# Patient Record
Sex: Female | Born: 1940 | Race: White | Hispanic: No | Marital: Married | Smoking: Never smoker
Health system: Southern US, Community
[De-identification: ages and names within clinical notes are randomized; demographics above are authoritative.]

## PROBLEM LIST (undated history)

## (undated) DIAGNOSIS — N813 Complete uterovaginal prolapse: Secondary | ICD-10-CM

## (undated) DIAGNOSIS — E878 Other disorders of electrolyte and fluid balance, not elsewhere classified: Secondary | ICD-10-CM

## (undated) DIAGNOSIS — L570 Actinic keratosis: Secondary | ICD-10-CM

## (undated) DIAGNOSIS — G3184 Mild cognitive impairment, so stated: Secondary | ICD-10-CM

## (undated) DIAGNOSIS — I10 Essential (primary) hypertension: Secondary | ICD-10-CM

## (undated) DIAGNOSIS — J438 Other emphysema: Secondary | ICD-10-CM

## (undated) DIAGNOSIS — K529 Noninfective gastroenteritis and colitis, unspecified: Secondary | ICD-10-CM

## (undated) DIAGNOSIS — R7303 Prediabetes: Secondary | ICD-10-CM

## (undated) DIAGNOSIS — N814 Uterovaginal prolapse, unspecified: Secondary | ICD-10-CM

## (undated) DIAGNOSIS — N393 Stress incontinence (female) (male): Secondary | ICD-10-CM

## (undated) DIAGNOSIS — Z9109 Other allergy status, other than to drugs and biological substances: Secondary | ICD-10-CM

## (undated) DIAGNOSIS — K5792 Diverticulitis of intestine, part unspecified, without perforation or abscess without bleeding: Secondary | ICD-10-CM

## (undated) DIAGNOSIS — E039 Hypothyroidism, unspecified: Secondary | ICD-10-CM

## (undated) DIAGNOSIS — N189 Chronic kidney disease, unspecified: Secondary | ICD-10-CM

## (undated) DIAGNOSIS — N1831 Chronic kidney disease, stage 3a: Secondary | ICD-10-CM

## (undated) DIAGNOSIS — C50919 Malignant neoplasm of unspecified site of unspecified female breast: Secondary | ICD-10-CM

## (undated) HISTORY — DX: Noninfective gastroenteritis and colitis, unspecified: K52.9

## (undated) HISTORY — PX: COLONOSCOPY: SHX174

## (undated) HISTORY — DX: Actinic keratosis: L57.0

## (undated) HISTORY — DX: Malignant neoplasm of unspecified site of unspecified female breast: C50.919

## (undated) HISTORY — DX: Diverticulitis of intestine, part unspecified, without perforation or abscess without bleeding: K57.92

## (undated) HISTORY — DX: Other disorders of electrolyte and fluid balance, not elsewhere classified: E87.8

---
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
## 2004-11-08 ENCOUNTER — Emergency Department: Payer: Self-pay | Admitting: Unknown Physician Specialty

## 2004-11-18 ENCOUNTER — Emergency Department: Payer: Self-pay | Admitting: Emergency Medicine

## 2005-12-29 ENCOUNTER — Ambulatory Visit: Payer: Self-pay

## 2006-01-11 ENCOUNTER — Ambulatory Visit: Payer: Self-pay

## 2006-05-18 DIAGNOSIS — C4491 Basal cell carcinoma of skin, unspecified: Secondary | ICD-10-CM

## 2006-05-18 HISTORY — DX: Basal cell carcinoma of skin, unspecified: C44.91

## 2006-11-23 DIAGNOSIS — C439 Malignant melanoma of skin, unspecified: Secondary | ICD-10-CM

## 2006-11-23 HISTORY — DX: Malignant melanoma of skin, unspecified: C43.9

## 2007-01-18 DIAGNOSIS — D239 Other benign neoplasm of skin, unspecified: Secondary | ICD-10-CM

## 2007-01-18 DIAGNOSIS — D229 Melanocytic nevi, unspecified: Secondary | ICD-10-CM

## 2007-01-18 HISTORY — DX: Melanocytic nevi, unspecified: D22.9

## 2007-01-18 HISTORY — DX: Other benign neoplasm of skin, unspecified: D23.9

## 2007-07-29 ENCOUNTER — Emergency Department: Payer: Self-pay | Admitting: Internal Medicine

## 2007-08-13 ENCOUNTER — Emergency Department: Payer: Self-pay | Admitting: Emergency Medicine

## 2009-01-12 DIAGNOSIS — C50919 Malignant neoplasm of unspecified site of unspecified female breast: Secondary | ICD-10-CM

## 2009-01-12 HISTORY — DX: Malignant neoplasm of unspecified site of unspecified female breast: C50.919

## 2009-02-12 HISTORY — PX: BREAST SURGERY: SHX581

## 2009-02-12 HISTORY — PX: BREAST LUMPECTOMY: SHX2

## 2012-01-26 ENCOUNTER — Ambulatory Visit: Payer: Self-pay

## 2012-02-13 ENCOUNTER — Ambulatory Visit: Payer: Self-pay

## 2012-03-12 ENCOUNTER — Ambulatory Visit: Payer: Self-pay

## 2012-04-12 ENCOUNTER — Ambulatory Visit: Payer: Self-pay

## 2012-05-27 ENCOUNTER — Emergency Department: Payer: Self-pay | Admitting: Emergency Medicine

## 2012-05-27 LAB — BASIC METABOLIC PANEL
Anion Gap: 6 — ABNORMAL LOW (ref 7–16)
BUN: 19 mg/dL — ABNORMAL HIGH (ref 7–18)
Calcium, Total: 8.9 mg/dL (ref 8.5–10.1)
Chloride: 108 mmol/L — ABNORMAL HIGH (ref 98–107)
Co2: 26 mmol/L (ref 21–32)
Creatinine: 0.77 mg/dL (ref 0.60–1.30)
EGFR (African American): >60
EGFR (Non-African Amer.): >60
Glucose: 150 mg/dL — ABNORMAL HIGH (ref 65–99)
Sodium: 140 mmol/L (ref 136–145)

## 2012-05-27 LAB — CBC
HCT: 39.9 % (ref 35.0–47.0)
HGB: 13.5 g/dL (ref 12.0–16.0)
MCH: 30.3 pg (ref 26.0–34.0)
MCHC: 33.9 g/dL (ref 32.0–36.0)
MCV: 89 fL (ref 80–100)
Platelet: 185 10*3/uL (ref 150–440)
RBC: 4.46 10*6/uL (ref 3.80–5.20)
WBC: 13 10*3/uL — ABNORMAL HIGH (ref 3.6–11.0)

## 2013-07-03 DIAGNOSIS — E039 Hypothyroidism, unspecified: Secondary | ICD-10-CM | POA: Diagnosis present

## 2013-07-03 DIAGNOSIS — K219 Gastro-esophageal reflux disease without esophagitis: Secondary | ICD-10-CM | POA: Diagnosis present

## 2013-07-03 DIAGNOSIS — E7800 Pure hypercholesterolemia, unspecified: Secondary | ICD-10-CM | POA: Diagnosis present

## 2014-12-28 ENCOUNTER — Emergency Department
Admission: EM | Admit: 2014-12-28 | Discharge: 2014-12-28 | Disposition: A | Discharge status: Home / Self Care | Payer: Medicare PPO | Attending: Emergency Medicine | Admitting: Emergency Medicine

## 2014-12-28 DIAGNOSIS — R197 Diarrhea, unspecified: Secondary | ICD-10-CM | POA: Diagnosis not present

## 2014-12-28 DIAGNOSIS — Z7982 Long term (current) use of aspirin: Secondary | ICD-10-CM | POA: Insufficient documentation

## 2014-12-28 DIAGNOSIS — H539 Unspecified visual disturbance: Secondary | ICD-10-CM | POA: Insufficient documentation

## 2014-12-28 DIAGNOSIS — Z79899 Other long term (current) drug therapy: Secondary | ICD-10-CM | POA: Insufficient documentation

## 2014-12-28 DIAGNOSIS — R112 Nausea with vomiting, unspecified: Secondary | ICD-10-CM | POA: Insufficient documentation

## 2014-12-28 DIAGNOSIS — N390 Urinary tract infection, site not specified: Secondary | ICD-10-CM | POA: Diagnosis not present

## 2014-12-28 LAB — URINALYSIS COMPLETE WITH MICROSCOPIC (ARMC ONLY)
Bilirubin Urine: NEGATIVE
Glucose, UA: NEGATIVE mg/dL
KETONES UR: NEGATIVE mg/dL
NITRITE: POSITIVE — AB
PH: 6 (ref 5.0–8.0)
PROTEIN: NEGATIVE mg/dL
SPECIFIC GRAVITY, URINE: 1.014 (ref 1.005–1.030)

## 2014-12-28 LAB — COMPREHENSIVE METABOLIC PANEL
ALBUMIN: 4.6 g/dL (ref 3.5–5.0)
ALT: 26 U/L (ref 14–54)
ANION GAP: 7 (ref 5–15)
AST: 24 U/L (ref 15–41)
Alkaline Phosphatase: 62 U/L (ref 38–126)
BUN: 24 mg/dL — AB (ref 6–20)
CHLORIDE: 108 mmol/L (ref 101–111)
CO2: 24 mmol/L (ref 22–32)
Calcium: 9 mg/dL (ref 8.9–10.3)
Creatinine, Ser: 0.78 mg/dL (ref 0.44–1.00)
GFR calc Af Amer: >60 mL/min (ref >60)
GFR calc non Af Amer: >60 mL/min (ref >60)
GLUCOSE: 156 mg/dL — AB (ref 65–99)
POTASSIUM: 3.9 mmol/L (ref 3.5–5.1)
Sodium: 139 mmol/L (ref 135–145)
Total Bilirubin: 0.8 mg/dL (ref 0.3–1.2)
Total Protein: 8 g/dL (ref 6.5–8.1)

## 2014-12-28 LAB — CBC
HCT: 41.3 % (ref 35.0–47.0)
Hemoglobin: 13.8 g/dL (ref 12.0–16.0)
MCH: 30.5 pg (ref 26.0–34.0)
MCHC: 33.4 g/dL (ref 32.0–36.0)
MCV: 91.4 fL (ref 80.0–100.0)
Platelets: 189 10*3/uL (ref 150–440)
RBC: 4.52 MIL/uL (ref 3.80–5.20)
RDW: 13.6 % (ref 11.5–14.5)
WBC: 13.7 10*3/uL — AB (ref 3.6–11.0)

## 2014-12-28 LAB — TROPONIN I: Troponin I: <0.03 ng/mL (ref <0.031)

## 2014-12-28 LAB — LIPASE, BLOOD: Lipase: 24 U/L (ref 11–51)

## 2014-12-28 MED ORDER — ONDANSETRON 4 MG PO TBDP: 4.0000 mg | ORAL_TABLET | Freq: Three times a day (TID) | ORAL | Status: DC | PRN

## 2014-12-28 MED ORDER — SULFAMETHOXAZOLE-TRIMETHOPRIM 800-160 MG PO TABS: 1.0000 | ORAL_TABLET | Freq: Two times a day (BID) | ORAL | Status: DC

## 2014-12-28 MED ORDER — ONDANSETRON 4 MG PO TBDP
4.0000 mg | ORAL_TABLET | Freq: Once | ORAL | Status: AC
  Administered 2014-12-28: 4 mg via ORAL
  Filled 2014-12-28: qty 1

## 2014-12-28 MED ORDER — LOPERAMIDE HCL 2 MG PO TABS: 2.0000 mg | ORAL_TABLET | Freq: Four times a day (QID) | ORAL | Status: DC | PRN

## 2014-12-28 MED ORDER — PROMETHAZINE HCL 25 MG PO TABS
25.0000 mg | ORAL_TABLET | Freq: Once | ORAL | Status: AC
  Administered 2014-12-28: 25 mg via ORAL

## 2014-12-28 MED ORDER — SODIUM CHLORIDE 0.9 % IV BOLUS (SEPSIS)
1000.0000 mL | Freq: Once | INTRAVENOUS | Status: AC
  Administered 2014-12-28: 1000 mL via INTRAVENOUS

## 2014-12-28 MED ORDER — ONDANSETRON HCL 4 MG/2ML IJ SOLN
INTRAMUSCULAR | Status: AC
  Administered 2014-12-28: 4 mg via INTRAVENOUS
  Filled 2014-12-28: qty 2

## 2014-12-28 MED ORDER — METOCLOPRAMIDE HCL 5 MG/ML IJ SOLN
10.0000 mg | Freq: Once | INTRAMUSCULAR | Status: AC
  Administered 2014-12-28: 10 mg via INTRAVENOUS
  Filled 2014-12-28: qty 2

## 2014-12-28 MED ORDER — PROMETHAZINE HCL 25 MG PO TABS
ORAL_TABLET | ORAL | Status: AC
  Administered 2014-12-28: 25 mg via ORAL
  Filled 2014-12-28: qty 1

## 2014-12-28 MED ORDER — ONDANSETRON HCL 4 MG/2ML IJ SOLN
4.0000 mg | Freq: Once | INTRAMUSCULAR | Status: AC
  Administered 2014-12-28: 4 mg via INTRAVENOUS

## 2014-12-28 NOTE — ED Notes (Signed)
Pt in with co n.v.d and abd pain since 0300

## 2014-12-28 NOTE — ED Provider Notes (Signed)
[facility] Emergency Department Provider Note  ____________________________________________  Time seen: Approximately 7:35 AM  I have reviewed the triage vital signs and the nursing notes.   HISTORY  Chief Complaint Emesis    HPI Christina Cuevas is a 74 y.o. female with a history of multiple sclerosis not on immunosuppressants, diabetes, presenting with  nausea vomiting and diarrhea.Patient reports that yesterday she likely was exposed to somebody with a GI illness. At 3 AM she woke up with acute onset of 3 episodes of nonbloody nausea and vomiting, and loose stool. She had an associated brief period of time where her vision went black but she was still able to hear and respond to her husband. She did not have a syncopal episode, and denies any chest pain, shortness of breath, palpitations. She denies any abdominal pain, fever, chills, urinary symptoms, or significant changes in her blood sugars.   No past medical history on file.  There are no active problems to display for this patient.   No past surgical history on file.  Current Outpatient Rx  Name  Route  Sig  Dispense  Refill  . anastrozole (ARIMIDEX) 1 MG tablet   Oral   Take 1 tablet by mouth daily.         Marland Kitchen aspirin EC 81 MG tablet   Oral   Take 1 tablet by mouth daily.         . cetirizine (ZYRTEC) 10 MG tablet   Oral   Take 1 tablet by mouth daily.         . Cholecalciferol (VITAMIN D-1000 MAX ST) 1000 UNITS tablet   Oral   Take 2 tablets by mouth 2 (two) times daily.         . Cinnamon 500 MG capsule   Oral   Take 2 capsules by mouth daily.         Marland Kitchen gabapentin (NEURONTIN) 300 MG capsule   Oral   Take 300-600 mg by mouth 2 (two) times daily. 300 every morning and 600 every evening.         Marland Kitchen levothyroxine (SYNTHROID, LEVOTHROID) 88 MCG tablet   Oral   Take 1 tablet by mouth daily.         Marland Kitchen lisinopril (PRINIVIL,ZESTRIL) 10 MG tablet   Oral   Take 1 tablet by  mouth daily.         . pantoprazole (PROTONIX) 40 MG tablet   Oral   Take 1 tablet by mouth daily.         . Simethicone 80 MG TABS   Oral   Take 375 mg by mouth every morning.         . simvastatin (ZOCOR) 10 MG tablet   Oral   Take 1 tablet by mouth every evening.         . loperamide (IMODIUM A-D) 2 MG tablet   Oral   Take 1 tablet (2 mg total) by mouth 4 (four) times daily as needed.   12 tablet   0   . ondansetron (ZOFRAN ODT) 4 MG disintegrating tablet   Oral   Take 1 tablet (4 mg total) by mouth every 8 (eight) hours as needed for nausea or vomiting.   20 tablet   0     Allergies Review of patient's allergies indicates no known allergies.  No family history on file.  Social History Social History  Substance Use Topics  . Smoking status: Not on file  . Smokeless tobacco:  Not on file  . Alcohol Use: Not on file    Review of Systems Constitutional: No fever/chills. No lightheadedness or syncope. Eyes: Positive blackened vision. ENT: No sore throat. Cardiovascular: Denies chest pain, palpitations. Respiratory: Denies shortness of breath.  No cough. Gastrointestinal: No abdominal pain.  Positive nausea, positive vomiting.  Positive diarrhea.  No constipation. Genitourinary: Negative for dysuria. Musculoskeletal: Negative for back pain. Skin: Negative for rash. Neurological: Negative for headaches, focal weakness or numbness.  10-point ROS otherwise negative.  ____________________________________________   PHYSICAL EXAM:  VITAL SIGNS: ED Triage Vitals  Enc Vitals Group     BP 12/28/14 0645 136/85 mmHg     Pulse Rate 12/28/14 0645 81     Resp 12/28/14 0645 18     Temp --      Temp src --      SpO2 12/28/14 0645 100 %     Weight 12/28/14 0644 142 lb (64.411 kg)     Height 12/28/14 0644 5\' 1"  (1.549 m)     Head Cir --      Peak Flow --      Pain Score 12/28/14 0702 1     Pain Loc --      Pain Edu? --      Excl. in Saline? --      Constitutional: Alert and oriented. Well appearing and in no acute distress. Answer question appropriately. Eyes: Conjunctivae are normal.  EOMI. Head: Atraumatic. Nose: No congestion/rhinnorhea. Mouth/Throat: Mucous membranes are moist.  Neck: No stridor.  Supple.  No JVD Cardiovascular: Normal rate, regular rhythm. No murmurs, rubs or gallops.  Respiratory: Normal respiratory effort.  No retractions. Lungs CTAB.  No wheezes, rales or ronchi. Gastrointestinal: Soft and nontender. No distention. No peritoneal signs. Musculoskeletal: No LE edema.  Neurologic:  Normal speech and language. No gross focal neurologic deficits are appreciated.  Skin:  Skin is warm, dry and intact. No rash noted. Psychiatric: Mood and affect are normal. Speech and behavior are normal.  Normal judgement.  ____________________________________________   LABS (all labs ordered are listed, but only abnormal results are displayed)  Labs Reviewed  CBC - Abnormal; Notable for the following:    WBC 13.7 (*)    All other components within normal limits  COMPREHENSIVE METABOLIC PANEL - Abnormal; Notable for the following:    Glucose, Bld 156 (*)    BUN 24 (*)    All other components within normal limits  LIPASE, BLOOD  TROPONIN I  URINALYSIS COMPLETEWITH MICROSCOPIC ([facility] ONLY)   ____________________________________________  EKG  ED ECG REPORT I, Eula Listen, the attending physician, personally viewed and interpreted this ECG.   Date: 12/28/2014  EKG Time: 7:26  Rate: 69  Rhythm: normal sinus rhythm  Axis: Normal  Intervals:none  ST&T Change: No ST changes, no ST elevation.  ____________________________________________  RADIOLOGY  No results found.  ____________________________________________   PROCEDURES  Procedure(s) performed: None  Critical Care performed: No ____________________________________________   INITIAL IMPRESSION / ASSESSMENT AND PLAN / ED  COURSE  Pertinent labs & imaging results that were available during my care of the patient were reviewed by me and considered in my medical decision making (see chart for details).  74 y.o. female with a history of MS and diabetes presenting with 3 episodes of nausea vomiting and diarrhea after exposure to a person with GI symptoms. The patient is hemodynamically stable and has a reassuring abdominal exam. She is nontoxic. It is likely that she has either a foodborne illness or  viral GI illness. I will treat her symptomatically and get labs. Cardiac causes are much less likely but given her age and we'll do screening EKG and a troponin as well. Rule out UTI or electrolyte abnormalities. If workup in the emergency department is reassuring and the patient is symptomatically improved, will anticipate discharge home with close PMD follow-up. \ ----------------------------------------- 8:34 AM on 12/28/2014 -----------------------------------------  Patient's workup in the emergency department as well as her physical examination vital signs are reassuring. She has a mildly elevated white blood cell count but this appears to be baseline for her. Her electrolytes are within normal limits. I will plan to discharge her home with symptomatically treatment and have her follow-up with her primary care physician. Return precautions and follow-up instructions were discussed. ____________________________________________  FINAL CLINICAL IMPRESSION(S) / ED DIAGNOSES  Final diagnoses:  Nausea vomiting and diarrhea  Vision disturbance      NEW MEDICATIONS STARTED DURING THIS VISIT:  New Prescriptions   ONDANSETRON (ZOFRAN ODT) 4 MG DISINTEGRATING TABLET    Take 1 tablet (4 mg total) by mouth every 8 (eight) hours as needed for nausea or vomiting.     Eula Listen, MD 12/28/14 [phone]

## 2014-12-28 NOTE — Discharge Instructions (Signed)
Please take a clear liquid diet for the next 24 hours, then advance to a bland BRAT diet as tolerated. Please practice frequent and good handwashing to prevent the spread of infection.  Please return to the emergency department if you develop inability to keep down fluids, abdominal pain, fever, lightheadedness or fainting, or any other symptoms concerning to you.

## 2014-12-28 NOTE — ED Notes (Signed)
Pt presents to ED with 3 episodes of vomiting since 0300. Pt states she is a diabetic and her glucose has been up since vomiting (118, 134, 120, but normally runs in 90's. Pt states she "blacked out" after throwing up, she states she couldn't see her husband but she could hear him.

## 2014-12-28 NOTE — ED Notes (Signed)
Pt reports nausea is better. Per MD water given. Will re-evaluate.

## 2014-12-28 NOTE — ED Notes (Signed)
Pt reports nausea after a few sips of water. MD made aware.

## 2014-12-28 NOTE — ED Notes (Signed)
Pt informed to return if any life threatening symptoms occur.  

## 2016-04-20 ENCOUNTER — Ambulatory Visit (INDEPENDENT_AMBULATORY_CARE_PROVIDER_SITE_OTHER): Payer: Medicare PPO | Admitting: Obstetrics & Gynecology

## 2016-04-20 ENCOUNTER — Encounter: Payer: Self-pay | Admitting: Obstetrics & Gynecology

## 2016-04-20 VITALS — BP 100/70 | HR 76 | Ht 61.0 in | Wt 157.0 lb

## 2016-04-20 DIAGNOSIS — N950 Postmenopausal bleeding: Secondary | ICD-10-CM | POA: Diagnosis not present

## 2016-04-20 DIAGNOSIS — N814 Uterovaginal prolapse, unspecified: Secondary | ICD-10-CM | POA: Diagnosis not present

## 2016-04-20 NOTE — Progress Notes (Signed)
HPI:      [name] is a 76 y.o. [phone] who presents today for her pessary follow up and examination related to her pelvic floor weakening.  Pt reports tolerating the pessary well with occas random vaginal bleeding and  no vaginal discharge.  Symptoms of pelvic floor weakening have greatly improved. She is voiding and defecating without difficulty. She currently has a ring #6 pessary.  PMHx: She  has a past medical history of Breast cancer ([place]); Diabetes mellitus without complication ([place]); Diverticulitis; and Hyperchloremia. Also,  has a past surgical history that includes Appendectomy; Colonoscopy; and Breast lumpectomy., family history includes Breast cancer in her cousin; Cancer in her paternal uncle; Migraines in her maternal grandmother; Pancreatic cancer in her father; Parkinson's disease in her mother.,  reports that she has never smoked. She has never used smokeless tobacco. She reports that she does not drink alcohol or use drugs.  She has a current medication list which includes the following prescription(s): anastrozole, aspirin ec, gabapentin, levothyroxine, loperamide, pantoprazole, simethicone, simvastatin, cetirizine, cetirizine, cholecalciferol, cholecalciferol, cinnamon, lisinopril, ondansetron, and sulfamethoxazole-trimethoprim. Also, is allergic to ciprofloxacin and metronidazole.  Review of Systems  Constitutional: Negative for chills, fever and malaise/fatigue.  HENT: Negative for congestion, sinus pain and sore throat.   Eyes: Negative for blurred vision and pain.  Respiratory: Negative for cough and wheezing.   Cardiovascular: Negative for chest pain and leg swelling.  Gastrointestinal: Negative for abdominal pain, constipation, diarrhea, heartburn, nausea and vomiting.  Genitourinary: Negative for dysuria, frequency, hematuria and urgency.  Musculoskeletal: Negative for back pain, joint pain, myalgias and neck pain.  Skin: Negative for itching and rash.    Neurological: Negative for dizziness, tremors and weakness.  Endo/Heme/Allergies: Does not bruise/bleed easily.  Psychiatric/Behavioral: Negative for depression. The patient is not nervous/anxious and does not have insomnia.     Objective: BP 100/70   Pulse 76   Ht 5\' 1"  (1.549 m)   Wt 157 lb (71.2 kg)   BMI 29.66 kg/m  Physical Exam  Constitutional: She is oriented to person, place, and time. She appears well-developed and well-nourished. No distress.  Genitourinary: Vagina normal and uterus normal. Pelvic exam was performed with patient supine. There is no rash, tenderness or lesion on the right labia. There is no rash, tenderness or lesion on the left labia. No erythema or bleeding in the vagina. Right adnexum does not display mass and does not display tenderness. Left adnexum does not display mass and does not display tenderness. Cervix does not exhibit motion tenderness, discharge, polyp or nabothian cyst.   Uterus is mobile and midaxial. Uterus is not enlarged or exhibiting a mass.  Genitourinary Comments: Gr 4 cystocele Gr 3 uterine prolapse  Abdominal: Soft. She exhibits no distension. There is no tenderness.  Musculoskeletal: Normal range of motion.  Neurological: She is alert and oriented to person, place, and time. No cranial nerve deficit.  Skin: Skin is warm and dry.  Psychiatric: She has a normal mood and affect.   Pessary Care Pessary removed and cleaned.  Vagina checked - without erosions - pessary replaced.  Endometrial Biopsy After discussion with the patient regarding her abnormal uterine bleeding I recommended that she proceed with an endometrial biopsy for further diagnosis. The risks, benefits, alternatives, and indications for an endometrial biopsy were discussed with the patient in detail. She understood the risks including infection, bleeding, cervical laceration and uterine perforation.  Verbal consent was obtained.   PROCEDURE NOTE:  [place] endometrial  biopsy was performed  using aseptic technique with iodine preparation.  The uterus was sounded to a length of 6 cm.  Adequate sampling was obtained with minimal blood loss.  The patient tolerated the procedure well.  Disposition will be pending pathology.  Barnett Applebaum, MD, [facility], [facility] 04/20/2016  10:52 AM   A/P:  Cystocele, Uterine Prolapse, PostMenopausal Bleeding (from pessary vs endometrial)  Pessary was cleaned and replaced today. Instructions given for care. Concerning symptoms to observe for are counseled to patient. Follow up scheduled for 3 months.  EMB today to assess for uterine concerns w bleeding    Call w results  Barnett Applebaum, MD, [facility], [facility] 04/20/2016  10:51 AM

## 2016-04-22 LAB — PATHOLOGY

## 2016-04-22 NOTE — Progress Notes (Signed)
EMB benign, possibility of polyp discussed.   If further significant bleeding then would eval or treat ([place], D&C).  Pt reassured. Barnett Applebaum, MD, [facility], [facility] 04/22/2016  5:01 PM

## 2016-07-20 ENCOUNTER — Ambulatory Visit (INDEPENDENT_AMBULATORY_CARE_PROVIDER_SITE_OTHER): Payer: Medicare PPO | Admitting: Obstetrics & Gynecology

## 2016-07-20 ENCOUNTER — Encounter: Payer: Self-pay | Admitting: Obstetrics & Gynecology

## 2016-07-20 VITALS — BP 110/60 | HR 82 | Ht 61.0 in | Wt 157.0 lb

## 2016-07-20 DIAGNOSIS — N814 Uterovaginal prolapse, unspecified: Secondary | ICD-10-CM | POA: Diagnosis not present

## 2016-07-20 NOTE — Progress Notes (Signed)
  HPI:      [name] is a 76 y.o. [phone] who presents today for her pessary follow up and examination related to her pelvic floor weakening.  Pt reports tolerating the pessary well with  No further vaginal bleeding and no vaginal discharge.  Symptoms of pelvic floor weakening have greatly improved. She is voiding and defecating without difficulty. She currently has a Ring #6 pessary.  Reports 3 UTI over last 3 mos, seeing urology soon, may be related to [place].  PMHx: She  has a past medical history of Breast cancer ([place]); Diabetes mellitus without complication ([place]); Diverticulitis; and Hyperchloremia. Also,  has a past surgical history that includes Appendectomy; Colonoscopy; and Breast lumpectomy., family history includes Breast cancer in her cousin; Cancer in her paternal uncle; Migraines in her maternal grandmother; Pancreatic cancer in her father; Parkinson's disease in her mother.,  reports that she has never smoked. She has never used smokeless tobacco. She reports that she does not drink alcohol or use drugs.  She has a current medication list which includes the following prescription(s): anastrozole, aspirin ec, cetirizine, cetirizine, cholecalciferol, cholecalciferol, cinnamon, gabapentin, levothyroxine, lisinopril, loperamide, ondansetron, pantoprazole, simethicone, simvastatin, and sulfamethoxazole-trimethoprim. Also, is allergic to ciprofloxacin and metronidazole.  Review of Systems  All other systems reviewed and are negative.   Objective: BP 110/60   Pulse 82   Ht 5\' 1"  (1.549 m)   Wt 157 lb (71.2 kg)   BMI 29.66 kg/m  Physical Exam  Constitutional: She is oriented to person, place, and time. She appears well-developed and well-nourished. No distress.  Genitourinary: Vagina normal and uterus normal. Pelvic exam was performed with patient supine. There is no rash, tenderness or lesion on the right labia. There is no rash, tenderness or lesion on the left labia. No erythema or  bleeding in the vagina. Right adnexum does not display mass and does not display tenderness. Left adnexum does not display mass and does not display tenderness. Cervix does not exhibit motion tenderness, discharge, polyp or nabothian cyst.   Uterus is mobile and midaxial. Uterus is not enlarged or exhibiting a mass.  Genitourinary Comments: Gr 3 cystocele and uterine prolapse evident  Abdominal: Soft. She exhibits no distension. There is no tenderness.  Musculoskeletal: Normal range of motion.  Neurological: She is alert and oriented to person, place, and time. No cranial nerve deficit.  Skin: Skin is warm and dry.  Psychiatric: She has a normal mood and affect.   Pessary Care Pessary removed and cleaned.  Vagina checked - without erosions - pessary replaced.  A/P:  Pessary was cleaned and replaced today. Instructions given for care. Concerning symptoms to observe for are counseled to patient. Follow up scheduled for 3 months.  Barnett Applebaum, MD, [facility], [facility] 07/20/2016  9:12 AM

## 2016-08-05 ENCOUNTER — Ambulatory Visit: Payer: Self-pay | Admitting: Urology

## 2016-10-20 ENCOUNTER — Ambulatory Visit (INDEPENDENT_AMBULATORY_CARE_PROVIDER_SITE_OTHER): Payer: Medicare PPO | Admitting: Obstetrics & Gynecology

## 2016-10-20 ENCOUNTER — Encounter: Payer: Self-pay | Admitting: Obstetrics & Gynecology

## 2016-10-20 VITALS — BP 120/70 | HR 71 | Ht 61.0 in | Wt 145.0 lb

## 2016-10-20 DIAGNOSIS — N814 Uterovaginal prolapse, unspecified: Secondary | ICD-10-CM

## 2016-10-20 NOTE — Progress Notes (Signed)
HPI:      [name] is a 76 y.o. [phone] who presents today for her pessary follow up and examination related to her pelvic floor weakening.  Pt reports tolerating the pessary well with  no vaginal bleeding and  no vaginal discharge.  Symptoms of pelvic floor weakening have greatly improved. She is voiding and defecating without difficulty. She currently has a #6Ring w support pessary.  PMHx: She  has a past medical history of Breast cancer ([place]); Diabetes mellitus without complication ([place]); Diverticulitis; and Hyperchloremia. Also,  has a past surgical history that includes Appendectomy; Colonoscopy; and Breast lumpectomy., family history includes Breast cancer in her cousin; Cancer in her paternal uncle; Migraines in her maternal grandmother; Pancreatic cancer in her father; Parkinson's disease in her mother.,  reports that she has never smoked. She has never used smokeless tobacco. She reports that she does not drink alcohol or use drugs.  She has a current medication list which includes the following prescription(s): anastrozole, aspirin ec, cetirizine, cetirizine, cholecalciferol, cholecalciferol, cinnamon, gabapentin, levothyroxine, lisinopril, loperamide, ondansetron, pantoprazole, simethicone, simvastatin, and sulfamethoxazole-trimethoprim. Also, is allergic to ciprofloxacin and metronidazole.  Review of Systems  Constitutional: Negative for chills, fever and malaise/fatigue.  HENT: Negative for congestion, sinus pain and sore throat.   Eyes: Negative for blurred vision and pain.  Respiratory: Negative for cough and wheezing.   Cardiovascular: Negative for chest pain and leg swelling.  Gastrointestinal: Negative for abdominal pain, constipation, diarrhea, heartburn, nausea and vomiting.  Genitourinary: Negative for dysuria, frequency, hematuria and urgency.  Musculoskeletal: Negative for back pain, joint pain, myalgias and neck pain.  Skin: Negative for itching and rash.    Neurological: Negative for dizziness, tremors and weakness.  Endo/Heme/Allergies: Does not bruise/bleed easily.  Psychiatric/Behavioral: Negative for depression. The patient is not nervous/anxious and does not have insomnia.   All other systems reviewed and are negative.   Objective: BP 120/70   Pulse 71   Ht 5\' 1"  (1.549 m)   Wt 145 lb (65.8 kg)   BMI 27.40 kg/m  Physical Exam  Constitutional: She is oriented to person, place, and time. She appears well-developed and well-nourished. No distress.  Genitourinary: Vagina normal and uterus normal. Pelvic exam was performed with patient supine. There is no rash, tenderness or lesion on the right labia. There is no rash, tenderness or lesion on the left labia. No erythema or bleeding in the vagina. Right adnexum does not display mass and does not display tenderness. Left adnexum does not display mass and does not display tenderness. Cervix does not exhibit motion tenderness, discharge, polyp or nabothian cyst.   Uterus is mobile and midaxial. Uterus is not enlarged or exhibiting a mass.  Genitourinary Comments: Gr 3 cystocele and uterine prolapse evident   Abdominal: Soft. She exhibits no distension. There is no tenderness.  Musculoskeletal: Normal range of motion.  Neurological: She is alert and oriented to person, place, and time. No cranial nerve deficit.  Skin: Skin is warm and dry.  Psychiatric: She has a normal mood and affect.   Pessary Care Pessary removed and cleaned.  Vagina checked - without erosions - pessary replaced.  A/P: 1. Cystocele with uterine descensus Pessary was cleaned and replaced today. Instructions given for care. Concerning symptoms to observe for are counseled to patient. Follow up scheduled for 3 months. Recurrent UTI reported still.  Has not seen urology, but does see med doc and oncologist who say it is due to meds she is on. A total of  15 minutes were spent face-to-face with the patient during this  encounter and over half of that time dealt with counseling and coordination of care.  Barnett Applebaum, MD, [facility], [facility] 10/20/2016  2:17 PM

## 2016-12-18 ENCOUNTER — Encounter: Payer: Self-pay | Admitting: Radiology

## 2016-12-18 ENCOUNTER — Other Ambulatory Visit: Payer: Self-pay

## 2016-12-18 ENCOUNTER — Emergency Department
Admission: EM | Admit: 2016-12-18 | Discharge: 2016-12-19 | Disposition: A | Discharge status: Home / Self Care | Payer: Medicare PPO | Attending: Emergency Medicine | Admitting: Emergency Medicine

## 2016-12-18 ENCOUNTER — Emergency Department: Payer: Medicare PPO

## 2016-12-18 DIAGNOSIS — K529 Noninfective gastroenteritis and colitis, unspecified: Secondary | ICD-10-CM | POA: Insufficient documentation

## 2016-12-18 DIAGNOSIS — E119 Type 2 diabetes mellitus without complications: Secondary | ICD-10-CM | POA: Insufficient documentation

## 2016-12-18 DIAGNOSIS — N390 Urinary tract infection, site not specified: Secondary | ICD-10-CM | POA: Insufficient documentation

## 2016-12-18 DIAGNOSIS — R110 Nausea: Secondary | ICD-10-CM

## 2016-12-18 DIAGNOSIS — Z7982 Long term (current) use of aspirin: Secondary | ICD-10-CM | POA: Insufficient documentation

## 2016-12-18 DIAGNOSIS — Z79899 Other long term (current) drug therapy: Secondary | ICD-10-CM | POA: Diagnosis not present

## 2016-12-18 LAB — COMPREHENSIVE METABOLIC PANEL
ALK PHOS: 66 U/L (ref 38–126)
ALT: 25 U/L (ref 14–54)
ANION GAP: 10 (ref 5–15)
AST: 24 U/L (ref 15–41)
Albumin: 4 g/dL (ref 3.5–5.0)
BILIRUBIN TOTAL: 0.3 mg/dL (ref 0.3–1.2)
BUN: 24 mg/dL — ABNORMAL HIGH (ref 6–20)
CALCIUM: 9.1 mg/dL (ref 8.9–10.3)
CO2: 23 mmol/L (ref 22–32)
CREATININE: 0.9 mg/dL (ref 0.44–1.00)
Chloride: 107 mmol/L (ref 101–111)
GFR calc Af Amer: >60 mL/min (ref >60)
GFR calc non Af Amer: >60 mL/min (ref >60)
Glucose, Bld: 120 mg/dL — ABNORMAL HIGH (ref 65–99)
Potassium: 3.8 mmol/L (ref 3.5–5.1)
SODIUM: 140 mmol/L (ref 135–145)
TOTAL PROTEIN: 6.7 g/dL (ref 6.5–8.1)

## 2016-12-18 LAB — CBC WITH DIFFERENTIAL/PLATELET
BASOS ABS: 0 10*3/uL (ref 0–0.1)
BASOS PCT: 0 %
EOS ABS: 0.2 10*3/uL (ref 0–0.7)
EOS PCT: 2 %
HCT: 37.6 % (ref 35.0–47.0)
Hemoglobin: 12.7 g/dL (ref 12.0–16.0)
Lymphocytes Relative: 16 %
Lymphs Abs: 1.8 10*3/uL (ref 1.0–3.6)
MCH: 32.2 pg (ref 26.0–34.0)
MCHC: 33.6 g/dL (ref 32.0–36.0)
MCV: 95.6 fL (ref 80.0–100.0)
MONO ABS: 1 10*3/uL — AB (ref 0.2–0.9)
Monocytes Relative: 9 %
Neutro Abs: 8.2 10*3/uL — ABNORMAL HIGH (ref 1.4–6.5)
Neutrophils Relative %: 73 %
PLATELETS: 182 10*3/uL (ref 150–440)
RBC: 3.93 MIL/uL (ref 3.80–5.20)
RDW: 14.2 % (ref 11.5–14.5)
WBC: 11.1 10*3/uL — ABNORMAL HIGH (ref 3.6–11.0)

## 2016-12-18 LAB — LIPASE, BLOOD: Lipase: 28 U/L (ref 11–51)

## 2016-12-18 LAB — URINALYSIS, COMPLETE (UACMP) WITH MICROSCOPIC
BILIRUBIN URINE: NEGATIVE
Glucose, UA: NEGATIVE mg/dL
Ketones, ur: NEGATIVE mg/dL
NITRITE: NEGATIVE
PH: 6 (ref 5.0–8.0)
Protein, ur: NEGATIVE mg/dL
SPECIFIC GRAVITY, URINE: 1.011 (ref 1.005–1.030)

## 2016-12-18 MED ORDER — ONDANSETRON HCL 4 MG/2ML IJ SOLN
4.0000 mg | Freq: Once | INTRAMUSCULAR | Status: AC
  Administered 2016-12-18: 4 mg via INTRAVENOUS

## 2016-12-18 MED ORDER — ONDANSETRON HCL 4 MG/2ML IJ SOLN
INTRAMUSCULAR | Status: AC
  Filled 2016-12-18: qty 2

## 2016-12-18 MED ORDER — CEPHALEXIN 500 MG PO CAPS: 500.0000 mg | ORAL_CAPSULE | Freq: Three times a day (TID) | ORAL | 0 refills | Status: DC

## 2016-12-18 MED ORDER — CEPHALEXIN 500 MG PO CAPS
500.0000 mg | ORAL_CAPSULE | Freq: Once | ORAL | Status: AC
  Administered 2016-12-19: 500 mg via ORAL
  Filled 2016-12-18: qty 1

## 2016-12-18 MED ORDER — IOPAMIDOL (ISOVUE-300) INJECTION 61%
30.0000 mL | Freq: Once | INTRAVENOUS | Status: AC | PRN
  Administered 2016-12-18: 30 mL via ORAL

## 2016-12-18 MED ORDER — DICYCLOMINE HCL 20 MG PO TABS: 20.0000 mg | ORAL_TABLET | Freq: Three times a day (TID) | ORAL | 0 refills | Status: DC | PRN

## 2016-12-18 MED ORDER — IOPAMIDOL (ISOVUE-300) INJECTION 61%
100.0000 mL | Freq: Once | INTRAVENOUS | Status: AC | PRN
  Administered 2016-12-18: 100 mL via INTRAVENOUS

## 2016-12-18 MED ORDER — ONDANSETRON HCL 4 MG PO TABS: 4.0000 mg | ORAL_TABLET | Freq: Three times a day (TID) | ORAL | 0 refills | Status: DC | PRN

## 2016-12-18 NOTE — Discharge Instructions (Signed)
Please seek medical attention for any high fevers, chest pain, shortness of breath, change in behavior, persistent vomiting, bloody stool or any other new or concerning symptoms.  

## 2016-12-18 NOTE — ED Provider Notes (Signed)
[facility] Emergency Department Provider Note   ____________________________________________   I have reviewed the triage vital signs and the nursing notes.   HISTORY  Chief Complaint Nausea   History limited by: Not Limited   HPI Christina Cuevas is a 76 y.o. female who presents to the emergency department today because of concern for nausea, vomiting and abdominal discomfort.   LOCATION:discomfort located in the left lower quadrant DURATION:less than one hour TIMING: started suddenly SEVERITY: severe QUALITY: discomfort CONTEXT: patient was at an [place] game. Had just eaten a hot dog. Shortly thereafter started feeling sick. Went to the bathroom and vomiting. Developed left sided abdominal pain. States she has a history of diverticulitis and this feels similar to her previous episode. MODIFYING FACTORS: none ASSOCIATED SYMPTOMS: diaphoresis. Denies fevers  Per medical record review patient has a history of diverticulitis.   Past Medical History:  Diagnosis Date  . Breast cancer ([place])   . Diabetes mellitus without complication ([place])   . Diverticulitis   . Hyperchloremia     Patient Active Problem List   Diagnosis Date Noted  . 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 APPENDECTOMY    . BREAST LUMPECTOMY    . COLONOSCOPY      Prior to Admission medications   Medication Sig Start Date End Date Taking? Authorizing Provider  anastrozole (ARIMIDEX) 1 MG tablet Take 1 tablet by mouth daily. 03/05/14   [provider]  aspirin EC 81 MG tablet Take 1 tablet by mouth daily.    [provider]  cetirizine (ZYRTEC) 10 MG tablet Take 1 tablet by mouth daily.    [provider]  cetirizine (ZYRTEC) 10 MG tablet Take by mouth.    [provider]  Cholecalciferol (VITAMIN D-1000 MAX ST) 1000 UNITS tablet Take 2 tablets by mouth 2 (two) times daily.     [provider]  Cholecalciferol (VITAMIN D-1000 MAX ST) 1000 units tablet Take by mouth.    [provider]  Cinnamon 500 MG capsule Take 2 capsules by mouth daily.    [provider]  gabapentin (NEURONTIN) 300 MG capsule Take 300-600 mg by mouth 2 (two) times daily. 300 every morning and 600 every evening. 12/12/14   [provider]  levothyroxine (SYNTHROID, LEVOTHROID) 88 MCG tablet Take 1 tablet by mouth daily. 07/20/14   [provider]  lisinopril (PRINIVIL,ZESTRIL) 10 MG tablet Take 1 tablet by mouth daily. 09/24/14   [provider]  loperamide (IMODIUM A-D) 2 MG tablet Take 1 tablet (2 mg total) by mouth 4 (four) times daily as needed. 12/28/14   Eula Listen, MD  ondansetron (ZOFRAN ODT) 4 MG disintegrating tablet Take 1 tablet (4 mg total) by mouth every 8 (eight) hours as needed for nausea or vomiting. Patient not taking: Reported on 04/20/2016 12/28/14   Eula Listen, MD  pantoprazole (PROTONIX) 40 MG tablet Take 1 tablet by mouth daily. 09/24/14   [provider]  Simethicone 80 MG TABS Take 375 mg by mouth every morning.    [provider]  simvastatin (ZOCOR) 10 MG tablet Take 1 tablet by mouth every evening. 11/01/14   [provider]  sulfamethoxazole-trimethoprim (BACTRIM DS,SEPTRA DS) 800-160 MG tablet Take 1 tablet by mouth 2 (two) times daily. Patient not taking: Reported on 04/20/2016 12/28/14   Eula Listen, MD    Allergies Ciprofloxacin and Metronidazole  Family History  Problem Relation Age of Onset  .  Parkinson's disease Mother   . Pancreatic cancer Father   . Cancer Paternal Uncle        2 uncles   . Migraines Maternal Grandmother   . Breast cancer Cousin     Social History Social History   Tobacco Use  . Smoking status: Never Smoker  . Smokeless tobacco: Never Used  Substance Use Topics  . Alcohol use: No  . Drug use: No    Review of  Systems Constitutional: No fever/chills Eyes: No visual changes. ENT: No sore throat. Cardiovascular: Denies chest pain. Respiratory: Denies shortness of breath. Gastrointestinal: Positive for abdominal pain, nausea and vomiting. Genitourinary: Negative for dysuria. Musculoskeletal: Negative for back pain. Skin: Negative for rash. Neurological: Negative for headaches, focal weakness or numbness.  ____________________________________________   PHYSICAL EXAM:  VITAL SIGNS: ED Triage Vitals  Enc Vitals Group     BP 12/18/16 2004 (!) 141/70     Pulse Rate 12/18/16 2004 63     Resp --      Temp 12/18/16 2004 (!) 97.5 F (36.4 C)     Temp Source 12/18/16 2004 Oral     SpO2 12/18/16 2004 95 %     Weight 12/18/16 2004 140 lb (63.5 kg)     Height 12/18/16 2004 5\' 1"  (1.549 m)     Head Circumference --      Peak Flow --      Pain Score 12/18/16 2011 3   Constitutional: Alert and oriented. Well appearing and in no distress. Eyes: Conjunctivae are normal.  ENT   Head: Normocephalic and atraumatic.   Nose: No congestion/rhinnorhea.   Mouth/Throat: Mucous membranes are moist.   Neck: No stridor. Hematological/Lymphatic/Immunilogical: No cervical lymphadenopathy. Cardiovascular: Normal rate, regular rhythm.  No murmurs, rubs, or gallops.  Respiratory: Normal respiratory effort without tachypnea nor retractions. Breath sounds are clear and equal bilaterally. No wheezes/rales/rhonchi. Gastrointestinal: Soft and minimally tender in the left side of the abdomen. No rebound. No guarding.  Genitourinary: Deferred Musculoskeletal: Normal range of motion in all extremities. No lower extremity edema. Neurologic:  Normal speech and language. No gross focal neurologic deficits are appreciated.  Skin:  Skin is warm, dry and intact. No rash noted. Psychiatric: Mood and affect are normal. Speech and behavior are normal. Patient exhibits appropriate insight and  judgment.  ____________________________________________    LABS (pertinent positives/negatives)  CBC wbc 11.1, hgb 12.7 UA 6-30 wbcs, large leukocytes CMP wnl except glu 120, bun 24 Lipase 28 ____________________________________________   EKG  None   ____________________________________________    RADIOLOGY  CT abd/pel Colitis Dilated pancreatic duct  ____________________________________________   PROCEDURES  Procedures  ____________________________________________   INITIAL IMPRESSION / ASSESSMENT AND PLAN / ED COURSE  Pertinent labs & imaging results that were available during my care of the patient were reviewed by me and considered in my medical decision making (see chart for details).  Patient with abdominal pain and nausea. Ddx is broad including gastroenteritis, pancreatitis, diverticulitis, UTI amongst other etiologies. Work up is consistent with UTI and colitis. Discussed these findings with the patient. Also discussed finding of pancreatic ductal dilatation. Patient is aware of this finding and states that she already has MRI scheduled at [facility] in roughly 1 month.   ____________________________________________   FINAL CLINICAL IMPRESSION(S) / ED DIAGNOSES  Final diagnoses:  Lower urinary tract infectious disease  Colitis  Nausea     Note: This dictation was prepared with Dragon dictation. Any transcriptional errors that result from this process are unintentional  Nance Pear, MD 12/18/16 [phone]

## 2016-12-18 NOTE — ED Triage Notes (Addendum)
Pt reports after eating hotdog at basketball game she became nauseated and felt like she was going to faint. Pt reports this is similar to when she had diverticulitis.

## 2016-12-20 LAB — URINE CULTURE

## 2017-01-22 ENCOUNTER — Encounter: Payer: Self-pay | Admitting: Obstetrics & Gynecology

## 2017-01-22 ENCOUNTER — Ambulatory Visit: Payer: Medicare PPO | Admitting: Obstetrics & Gynecology

## 2017-01-22 VITALS — BP 120/80 | HR 78 | Ht 62.0 in | Wt 146.0 lb

## 2017-01-22 DIAGNOSIS — N814 Uterovaginal prolapse, unspecified: Secondary | ICD-10-CM | POA: Diagnosis not present

## 2017-01-22 NOTE — Progress Notes (Signed)
  HPI:      [name] [phone] who presents today for her pessary follow up and examination related to her pelvic floor weakening.  Pt reports tolerating the pessary well with  no vaginal bleeding and  no vaginal discharge.  Symptoms of pelvic floor weakening have greatly improved. She is voiding and defecating without difficulty. She currently has a RING#6 pessary.  PMHx: She  has a past medical history of Breast cancer ([place]), Colitis, Diabetes mellitus without complication ([place]), Diverticulitis, and Hyperchloremia. Also,  has a past surgical history that includes Appendectomy; Colonoscopy; and Breast lumpectomy., family history includes Breast cancer in her cousin; Cancer in her paternal uncle; Migraines in her maternal grandmother; Pancreatic cancer in her father; Parkinson's disease in her mother.,  reports that  has never smoked. she has never used smokeless tobacco. She reports that she does not drink alcohol or use drugs.  She has a current medication list which includes the following prescription(s): anastrozole, aspirin ec, cephalexin, cetirizine, cetirizine, cholecalciferol, cinnamon, dicyclomine, levothyroxine, lisinopril, loperamide, ondansetron, pantoprazole, pregabalin, simethicone, and simvastatin. Also, is allergic to ciprofloxacin and metronidazole.  Review of Systems  All other systems reviewed and are negative.  Objective: BP 120/80   Pulse 78   Ht 5\' 2"  (1.575 m)   Wt 146 lb (66.2 kg)   BMI 26.70 kg/m  Physical Exam  Constitutional: She is oriented to person, place, and time. She appears well-developed and well-nourished. No distress.  Genitourinary: Vagina normal and uterus normal. Pelvic exam was performed with patient supine. There is no rash, tenderness or lesion on the right labia. There is no rash, tenderness or lesion on the left labia. No erythema or bleeding in the vagina. Right adnexum does not display mass and does not display tenderness. Left  adnexum does not display mass and does not display tenderness. Cervix does not exhibit motion tenderness, discharge, polyp or nabothian cyst.   Uterus is mobile and midaxial. Uterus is not enlarged or exhibiting a mass.  Genitourinary Comments: Gr 3 cystocele and uterine prolapse evident    Abdominal: Soft. She exhibits no distension. There is no tenderness.  Musculoskeletal: Normal range of motion.  Neurological: She is alert and oriented to person, place, and time. No cranial nerve deficit.  Skin: Skin is warm and dry.  Psychiatric: She has a normal mood and affect.   Pessary Care Pessary removed and cleaned.  Vagina checked - without erosions - pessary replaced.  A/P:1. Cystocele with uterine descensus Pessary was cleaned and replaced today. Instructions given for care. Concerning symptoms to observe for are counseled to patient. Follow up scheduled for 3 months. A total of 15 minutes were spent face-to-face with the patient during this encounter and over half of that time dealt with counseling and coordination of care.  Barnett Applebaum, MD, [facility], [facility] 01/22/2017  9:58 AM

## 2017-04-23 ENCOUNTER — Ambulatory Visit: Payer: Medicare PPO | Admitting: Obstetrics & Gynecology

## 2017-04-23 ENCOUNTER — Encounter: Payer: Self-pay | Admitting: Obstetrics & Gynecology

## 2017-04-23 VITALS — BP 140/80 | Ht 62.0 in | Wt 150.0 lb

## 2017-04-23 DIAGNOSIS — N814 Uterovaginal prolapse, unspecified: Secondary | ICD-10-CM

## 2017-04-23 NOTE — Progress Notes (Signed)
  HPI:      Ms. Chirstine Cuevas is a 77 y.o. [phone] who presents today for her pessary follow up and examination related to her pelvic floor weakening.  Pt reports tolerating the pessary well with  no vaginal bleeding and  no vaginal discharge.  Symptoms of pelvic floor weakening have greatly improved. She is voiding and defecating without difficulty. She currently has a Ring #6 pessary.  PMHx: She  has a past medical history of Breast cancer ([place]), Colitis, Diabetes mellitus without complication ([place]), Diverticulitis, and Hyperchloremia. Also,  has a past surgical history that includes Appendectomy; Colonoscopy; and Breast lumpectomy., family history includes Breast cancer in her cousin; Cancer in her paternal uncle; Migraines in her maternal grandmother; Pancreatic cancer in her father; Parkinson's disease in her mother.,  reports that she has never smoked. She has never used smokeless tobacco. She reports that she does not drink alcohol or use drugs.  She has a current medication list which includes the following prescription(s): anastrozole, aspirin ec, cephalexin, cetirizine, cetirizine, cholecalciferol, cinnamon, dicyclomine, levothyroxine, lisinopril, loperamide, ondansetron, pantoprazole, pregabalin, simethicone, and simvastatin. Also, is allergic to ciprofloxacin and metronidazole.  Review of Systems  All other systems reviewed and are negative.   Objective: BP 140/80   Ht 5\' 2"  (1.575 m)   Wt 150 lb (68 kg)   BMI 27.44 kg/m  Physical Exam  Constitutional: She is oriented to person, place, and time. She appears well-developed and well-nourished. No distress.  Genitourinary: Vagina normal and uterus normal. Pelvic exam was performed with patient supine. There is no rash, tenderness or lesion on the right labia. There is no rash, tenderness or lesion on the left labia. No erythema or bleeding in the vagina. Right adnexum does not display mass and does not display tenderness. Left adnexum  does not display mass and does not display tenderness. Cervix does not exhibit motion tenderness, discharge, polyp or nabothian cyst.   Uterus is mobile and midaxial. Uterus is not enlarged or exhibiting a mass.  Genitourinary Comments: Gr 3 cystocele and uterine prolapse evident   Abdominal: Soft. She exhibits no distension. There is no tenderness.  Musculoskeletal: Normal range of motion.  Neurological: She is alert and oriented to person, place, and time. No cranial nerve deficit.  Skin: Skin is warm and dry.  Psychiatric: She has a normal mood and affect.   Pessary Care Pessary removed and cleaned.  Vagina checked - with small area of contact bleeding at apex; pessary replaced.  A/P: Pessary was cleaned and replaced today. Instructions given for care. Concerning symptoms to observe for are counseled to patient. Monitor for bleeding; consider holiday if she has sx's Follow up scheduled for 3 months.  A total of 15 minutes were spent face-to-face with the patient during this encounter and over half of that time dealt with counseling and coordination of care.  Barnett Applebaum, MD, [facility], [facility] 04/23/2017  9:56 AM

## 2017-05-27 ENCOUNTER — Encounter: Payer: Self-pay | Admitting: Obstetrics & Gynecology

## 2017-05-27 ENCOUNTER — Ambulatory Visit: Payer: Medicare PPO | Admitting: Obstetrics & Gynecology

## 2017-05-27 VITALS — BP 120/80 | Ht 62.0 in | Wt 150.0 lb

## 2017-05-27 DIAGNOSIS — N950 Postmenopausal bleeding: Secondary | ICD-10-CM

## 2017-05-27 NOTE — Progress Notes (Signed)
Postmenopausal Bleeding Patient complains of vaginal bleeding. She has been menopausal for several years. Currently on no HRT.  Bleeding is described as spotting and has occurred a few times last week then stopped.  Has pessary for years; also has h/o POP Other menopausal symptoms include: none. Workup to date: none.  No pain or assoc sx's.  No modifiers.  Menstrual History: No LMP recorded. Patient is postmenopausal.   PMHx: She  has a past medical history of Breast cancer ([place]), Colitis, Diabetes mellitus without complication ([place]), Diverticulitis, and Hyperchloremia. Also,  has a past surgical history that includes Appendectomy; Colonoscopy; and Breast lumpectomy., family history includes Breast cancer in her cousin; Cancer in her paternal uncle; Migraines in her maternal grandmother; Pancreatic cancer in her father; Parkinson's disease in her mother.,  reports that she has never smoked. She has never used smokeless tobacco. She reports that she does not drink alcohol or use drugs.  She has a current medication list which includes the following prescription(s): anastrozole, aspirin ec, cephalexin, cetirizine, cetirizine, cholecalciferol, cinnamon, dicyclomine, levothyroxine, lisinopril, loperamide, ondansetron, pantoprazole, pregabalin, simethicone, and simvastatin. Also, is allergic to ciprofloxacin and metronidazole.  Review of Systems  Constitutional: Negative for chills, fever and malaise/fatigue.  HENT: Negative for congestion, sinus pain and sore throat.   Eyes: Negative for blurred vision and pain.  Respiratory: Negative for cough and wheezing.   Cardiovascular: Negative for chest pain and leg swelling.  Gastrointestinal: Negative for abdominal pain, constipation, diarrhea, heartburn, nausea and vomiting.  Genitourinary: Negative for dysuria, frequency, hematuria and urgency.  Musculoskeletal: Negative for back pain, joint pain, myalgias and neck pain.  Skin: Negative for itching and  rash.  Neurological: Negative for dizziness, tremors and weakness.  Endo/Heme/Allergies: Does not bruise/bleed easily.  Psychiatric/Behavioral: Negative for depression. The patient is not nervous/anxious and does not have insomnia.    Objective: BP 120/80   Ht 5\' 2"  (1.575 m)   Wt 150 lb (68 kg)   BMI 27.44 kg/m  Physical Exam  Constitutional: She is oriented to person, place, and time. She appears well-developed and well-nourished. No distress.  Genitourinary: Vagina normal and uterus normal. Pelvic exam was performed with patient supine. There is no rash, tenderness or lesion on the right labia. There is no rash, tenderness or lesion on the left labia. No erythema or bleeding in the vagina. Right adnexum does not display mass and does not display tenderness. Left adnexum does not display mass and does not display tenderness. Cervix does not exhibit motion tenderness, discharge, polyp or nabothian cyst.   Uterus is mobile and midaxial. Uterus is not enlarged or exhibiting a mass.  Genitourinary Comments: Gr 3 cystocele and uterine prolapse evident    HENT:  Head: Normocephalic and atraumatic.  Nose: Nose normal.  Mouth/Throat: Oropharynx is clear and moist.  Abdominal: Soft. She exhibits no distension. There is no tenderness.  Musculoskeletal: Normal range of motion.  Neurological: She is alert and oriented to person, place, and time. No cranial nerve deficit.  Skin: Skin is warm and dry.  Psychiatric: She has a normal mood and affect.  Pessary removed.  Vaginal apex has area that bleeds on contact with qtip; no deep ulceration.  Cervix appears normal.  ASSESSMENT/PLAN:   1. Post-menopausal bleeding- new problem. Pessary holiday advised.  Removed today.  F/u one month.  Monitor for further bleeding. If continues to bleed, then ultrasound and possible EMB to assess for alternative etiology.  Last eval was 2018 w normal EMB.  A total of  15 minutes were spent face-to-face with the  patient during this encounter and over half of that time dealt with counseling and coordination of care.  Barnett Applebaum, MD, [facility], [facility] 05/27/2017  2:52 PM

## 2017-06-28 ENCOUNTER — Encounter: Payer: Self-pay | Admitting: Obstetrics & Gynecology

## 2017-06-28 ENCOUNTER — Ambulatory Visit: Payer: Medicare PPO | Admitting: Obstetrics & Gynecology

## 2017-06-28 VITALS — BP 120/80 | Ht 62.0 in | Wt 152.0 lb

## 2017-06-28 DIAGNOSIS — N814 Uterovaginal prolapse, unspecified: Secondary | ICD-10-CM | POA: Diagnosis not present

## 2017-06-28 NOTE — Progress Notes (Signed)
  HPI:      [name] [phone] who presents today for her pessary follow up and examination related to her pelvic floor weakening.  Pt has been on pessary holiday due to past bleeding.  No further bleeding reported since pessary has been out.  She does note improved LOU without the pessary.  PMHx: She  has a past medical history of Breast cancer ([place]), Colitis, Diabetes mellitus without complication ([place]), Diverticulitis, and Hyperchloremia. Also,  has a past surgical history that includes Appendectomy; Colonoscopy; and Breast lumpectomy., family history includes Breast cancer in her cousin; Cancer in her paternal uncle; Migraines in her maternal grandmother; Pancreatic cancer in her father; Parkinson's disease in her mother.,  reports that she has never smoked. She has never used smokeless tobacco. She reports that she does not drink alcohol or use drugs.  She has a current medication list which includes the following prescription(s): anastrozole, aspirin ec, cephalexin, cetirizine, cetirizine, cholecalciferol, cinnamon, dicyclomine, levothyroxine, lisinopril, loperamide, ondansetron, pantoprazole, pregabalin, simethicone, and simvastatin. Also, is allergic to ciprofloxacin and metronidazole.  Review of Systems  All other systems reviewed and are negative.   Objective: BP 120/80   Ht 5\' 2"  (1.575 m)   Wt 152 lb (68.9 kg)   BMI 27.80 kg/m  Physical Exam  Constitutional: She is oriented to person, place, and time. She appears well-developed and well-nourished. No distress.  Genitourinary: Vagina normal and uterus normal. Pelvic exam was performed with patient supine. There is no rash, tenderness or lesion on the right labia. There is no rash, tenderness or lesion on the left labia. No erythema or bleeding in the vagina. Right adnexum does not display mass and does not display tenderness. Left adnexum does not display mass and does not display tenderness. Cervix does not exhibit  motion tenderness, discharge, polyp or nabothian cyst.   Uterus is mobile and midaxial. Uterus is not enlarged or exhibiting a mass.  Genitourinary Comments: Gr 3 Cystocele and uterine prolapse Gr 2 Min atrophy to vaginal tissues, no erosions or bleeding  HENT:  Head: Normocephalic and atraumatic.  Nose: Nose normal.  Mouth/Throat: Oropharynx is clear and moist.  Abdominal: Soft. She exhibits no distension. There is no tenderness.  Musculoskeletal: Normal range of motion.  Neurological: She is alert and oriented to person, place, and time. No cranial nerve deficit.  Skin: Skin is warm and dry.  Psychiatric: She has a normal mood and affect.   A/P: Pessary was cleaned and replaced today.  Plan changing to INCONTINENCE RING, size 6 is too big today so will order appropriate size (anticipate #4) Instructions given for care. Concerning symptoms to observe for are counseled to patient. Follow up scheduled for 3 months.  A total of 15 minutes were spent face-to-face with the patient during this encounter and over half of that time dealt with counseling and coordination of care.  Barnett Applebaum, MD, [facility], [facility] 06/28/2017  9:38 AM

## 2017-07-08 ENCOUNTER — Emergency Department
Admission: EM | Admit: 2017-07-08 | Discharge: 2017-07-08 | Disposition: A | Discharge status: Home / Self Care | Payer: Medicare PPO | Attending: Student in an Organized Health Care Education/Training Program | Admitting: Student in an Organized Health Care Education/Training Program

## 2017-07-08 ENCOUNTER — Other Ambulatory Visit: Payer: Self-pay

## 2017-07-08 ENCOUNTER — Encounter: Payer: Self-pay | Admitting: Emergency Medicine

## 2017-07-08 ENCOUNTER — Emergency Department: Payer: Medicare PPO

## 2017-07-08 DIAGNOSIS — Z7982 Long term (current) use of aspirin: Secondary | ICD-10-CM | POA: Insufficient documentation

## 2017-07-08 DIAGNOSIS — Z853 Personal history of malignant neoplasm of breast: Secondary | ICD-10-CM | POA: Insufficient documentation

## 2017-07-08 DIAGNOSIS — R1013 Epigastric pain: Secondary | ICD-10-CM | POA: Insufficient documentation

## 2017-07-08 DIAGNOSIS — R110 Nausea: Secondary | ICD-10-CM | POA: Diagnosis present

## 2017-07-08 DIAGNOSIS — Z79899 Other long term (current) drug therapy: Secondary | ICD-10-CM | POA: Diagnosis not present

## 2017-07-08 DIAGNOSIS — E119 Type 2 diabetes mellitus without complications: Secondary | ICD-10-CM | POA: Insufficient documentation

## 2017-07-08 DIAGNOSIS — N3001 Acute cystitis with hematuria: Secondary | ICD-10-CM | POA: Diagnosis not present

## 2017-07-08 DIAGNOSIS — E860 Dehydration: Secondary | ICD-10-CM | POA: Diagnosis not present

## 2017-07-08 DIAGNOSIS — R531 Weakness: Secondary | ICD-10-CM | POA: Insufficient documentation

## 2017-07-08 LAB — URINALYSIS, COMPLETE (UACMP) WITH MICROSCOPIC
BILIRUBIN URINE: NEGATIVE
GLUCOSE, UA: NEGATIVE mg/dL
KETONES UR: 20 mg/dL — AB
NITRITE: NEGATIVE
PH: 7 (ref 5.0–8.0)
Protein, ur: 30 mg/dL — AB
Specific Gravity, Urine: 1.019 (ref 1.005–1.030)

## 2017-07-08 LAB — HEPATIC FUNCTION PANEL
ALT: 20 U/L (ref 0–44)
AST: 27 U/L (ref 15–41)
Albumin: 4.7 g/dL (ref 3.5–5.0)
Alkaline Phosphatase: 56 U/L (ref 38–126)
Bilirubin, Direct: <0.1 mg/dL (ref 0.0–0.2)
Total Bilirubin: 0.7 mg/dL (ref 0.3–1.2)
Total Protein: 7.9 g/dL (ref 6.5–8.1)

## 2017-07-08 LAB — TROPONIN I
Troponin I: <0.03 ng/mL (ref <0.03)
Troponin I: <0.03 ng/mL (ref <0.03)

## 2017-07-08 LAB — CBC
HCT: 39.5 % (ref 35.0–47.0)
Hemoglobin: 13.7 g/dL (ref 12.0–16.0)
MCH: 32.4 pg (ref 26.0–34.0)
MCHC: 34.6 g/dL (ref 32.0–36.0)
MCV: 93.6 fL (ref 80.0–100.0)
Platelets: 220 10*3/uL (ref 150–440)
RBC: 4.22 MIL/uL (ref 3.80–5.20)
RDW: 13.4 % (ref 11.5–14.5)
WBC: 9.3 10*3/uL (ref 3.6–11.0)

## 2017-07-08 LAB — BASIC METABOLIC PANEL
Anion gap: 12 (ref 5–15)
BUN: 23 mg/dL (ref 8–23)
CO2: 20 mmol/L — ABNORMAL LOW (ref 22–32)
Calcium: 9.9 mg/dL (ref 8.9–10.3)
Chloride: 104 mmol/L (ref 98–111)
Creatinine, Ser: 1.01 mg/dL — ABNORMAL HIGH (ref 0.44–1.00)
GFR calc Af Amer: >60 mL/min (ref >60)
GFR calc non Af Amer: 53 mL/min — ABNORMAL LOW (ref >60)
Glucose, Bld: 166 mg/dL — ABNORMAL HIGH (ref 70–99)
Potassium: 4.5 mmol/L (ref 3.5–5.1)
Sodium: 136 mmol/L (ref 135–145)

## 2017-07-08 LAB — LIPASE, BLOOD: Lipase: 25 U/L (ref 11–51)

## 2017-07-08 MED ORDER — CEPHALEXIN 500 MG PO CAPS: 500.0000 mg | ORAL_CAPSULE | Freq: Three times a day (TID) | ORAL | 0 refills | Status: AC

## 2017-07-08 MED ORDER — METOCLOPRAMIDE HCL 5 MG/ML IJ SOLN
10.0000 mg | Freq: Once | INTRAMUSCULAR | Status: AC
  Administered 2017-07-08: 10 mg via INTRAVENOUS
  Filled 2017-07-08: qty 2

## 2017-07-08 MED ORDER — GI COCKTAIL ~~LOC~~
30.0000 mL | Freq: Once | ORAL | Status: AC
  Administered 2017-07-08: 30 mL via ORAL
  Filled 2017-07-08: qty 30

## 2017-07-08 MED ORDER — SODIUM CHLORIDE 0.9 % IV BOLUS
1000.0000 mL | Freq: Once | INTRAVENOUS | Status: AC
  Administered 2017-07-08: 1000 mL via INTRAVENOUS

## 2017-07-08 MED ORDER — ONDANSETRON 4 MG PO TBDP
ORAL_TABLET | ORAL | Status: AC
  Filled 2017-07-08: qty 1

## 2017-07-08 MED ORDER — METOCLOPRAMIDE HCL 10 MG PO TABS: 10.0000 mg | ORAL_TABLET | Freq: Four times a day (QID) | ORAL | 0 refills | Status: DC | PRN

## 2017-07-08 MED ORDER — ONDANSETRON 4 MG PO TBDP
4.0000 mg | ORAL_TABLET | Freq: Once | ORAL | Status: AC | PRN
  Administered 2017-07-08: 4 mg via ORAL

## 2017-07-08 MED ORDER — CEFTRIAXONE SODIUM 1 G IJ SOLR
1.0000 g | Freq: Once | INTRAMUSCULAR | Status: AC
  Administered 2017-07-08: 1 g via INTRAVENOUS
  Filled 2017-07-08: qty 10

## 2017-07-08 NOTE — ED Notes (Signed)
Pt reports that she has been nauseated since yesterday. States after cooking dinner last night, she started feeling "really hot" and couldn't cool down. She has also been traveling locally for the last several weeks and her son seems to believe this has caused fatigue for her. Has had chest pain.

## 2017-07-08 NOTE — ED Triage Notes (Signed)
Says had some nausea and not feeling well last night.  Went to bed and was oky to sleep.  This am she againhas the nausea and dicomfortin chest.

## 2017-07-08 NOTE — ED Provider Notes (Signed)
[facility] Emergency Department Provider Note    First MD Initiated Contact with Patient 07/08/17 1125     (approximate)  I have reviewed the triage vital signs and the nursing notes.   HISTORY  Chief Complaint Chest Pain and Nausea    HPI Christina Cuevas is a 77 y.o. female with a history of breast cancer status post mastectomy as well as history of MS in remission presents to the ER with nausea and epigastric pain started last night.  Had not had much to eat.  Felt very weak this morning and then while going to work patient felt that her leg legs were getting wobbly and that she was about to faint.  She called her PCP for a same-day appointment and was going to see the PA in that clinic but started feeling worse.  Was having some chest discomfort at this time to with the nausea.  No diaphoresis.  Wanted to lay down and based on the acuity of her symptoms was brought to the ER.  States that she is feeling better when laying down.  Has not had anything to drink today and is feeling very dehydrated.  Denies any chest pain right now but does have some nausea still.    Past Medical History:  Diagnosis Date  . Breast cancer ([place])   . Colitis   . Diabetes mellitus without complication ([place])   . Diverticulitis   . Hyperchloremia    Family History  Problem Relation Age of Onset  . Parkinson's disease Mother   . Pancreatic cancer Father   . Cancer Paternal Uncle        2 uncles   . Migraines Maternal Grandmother   . Breast cancer Cousin    Past Surgical History:  Procedure Laterality Date  . APPENDECTOMY    . BREAST LUMPECTOMY    . COLONOSCOPY     Patient Active Problem List   Diagnosis Date Noted  . 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 Authorizing Provider  anastrozole (ARIMIDEX) 1 MG tablet Take 1 tablet by mouth daily. 03/05/14   [provider]    aspirin EC 81 MG tablet Take 1 tablet by mouth daily.    [provider]  cephALEXin (KEFLEX) 500 MG capsule Take 1 capsule (500 mg total) by mouth 3 (three) times daily. 12/18/16   Nance Pear, MD  cetirizine (ZYRTEC) 10 MG tablet Take 1 tablet by mouth daily.    [provider]  cetirizine (ZYRTEC) 10 MG tablet Take by mouth.    [provider]  Cholecalciferol (VITAMIN D-1000 MAX ST) 1000 UNITS tablet Take 2 tablets by mouth 2 (two) times daily.    [provider]  Cinnamon 500 MG capsule Take 2 capsules by mouth daily.    [provider]  dicyclomine (BENTYL) 20 MG tablet Take 1 tablet (20 mg total) by mouth 3 (three) times daily as needed (abdominal pain). 12/18/16   Nance Pear, MD  levothyroxine (SYNTHROID, LEVOTHROID) 88 MCG tablet Take 1 tablet by mouth daily. 07/20/14   [provider]  lisinopril (PRINIVIL,ZESTRIL) 10 MG tablet Take 1 tablet by mouth daily. 09/24/14   [provider]  loperamide (IMODIUM A-D) 2 MG tablet Take 1 tablet (2 mg total) by mouth 4 (four) times daily as needed. 12/28/14   Eula Listen, MD  ondansetron (ZOFRAN) 4 MG tablet Take 1 tablet (4  mg total) by mouth every 8 (eight) hours as needed for nausea or vomiting. 12/18/16   Nance Pear, MD  pantoprazole (PROTONIX) 40 MG tablet Take 1 tablet by mouth daily. 09/24/14   [provider]  pregabalin (LYRICA) 75 MG capsule Take 75 mg by mouth 2 (two) times daily.    [provider]  Simethicone 80 MG TABS Take 375 mg by mouth every morning.    [provider]  simvastatin (ZOCOR) 10 MG tablet Take 0.5 tablets by mouth every evening.  11/01/14   [provider]    Allergies Ciprofloxacin and Metronidazole    Social History Social History   Tobacco Use  . Smoking status: Never Smoker  . Smokeless tobacco: Never Used  Substance Use Topics  . Alcohol use: No  . Drug use: No    Review of  Systems Patient denies headaches, rhinorrhea, blurry vision, numbness, shortness of breath, chest pain, edema, cough, abdominal pain, nausea, vomiting, diarrhea, dysuria, fevers, rashes or hallucinations unless otherwise stated above in HPI. ____________________________________________   PHYSICAL EXAM:  VITAL SIGNS: Vitals:   07/08/17 1044  BP: 139/66  Pulse: 76  Resp: (!) 22  Temp: 98.3 F (36.8 C)  SpO2: 100%    Constitutional: Alert and oriented.  Eyes: Conjunctivae are normal.  Head: Atraumatic. Nose: No congestion/rhinnorhea. Mouth/Throat: Mucous membranes are dry   Neck: No stridor. Painless ROM.  Cardiovascular: Normal rate, regular rhythm. Grossly normal heart sounds.  Good peripheral circulation. Respiratory: Normal respiratory effort.  No retractions. Lungs CTAB. Gastrointestinal: Soft and nontender. No distention. No abdominal bruits. No CVA tenderness. Genitourinary:  Musculoskeletal: No lower extremity tenderness nor edema.  No joint effusions. Neurologic:  Normal speech and language. No gross focal neurologic deficits are appreciated. No facial droop Skin:  Skin is warm, dry and intact. No rash noted. Psychiatric: Mood and affect are normal. Speech and behavior are normal.  ____________________________________________   LABS (all labs ordered are listed, but only abnormal results are displayed)  Results for orders placed or performed during the hospital encounter of 07/08/17 (from the past 24 hour(s))  Basic metabolic panel     Status: Abnormal   Collection Time: 07/08/17 10:53 AM  Result Value Ref Range   Sodium 136 135 - 145 mmol/L   Potassium 4.5 3.5 - 5.1 mmol/L   Chloride 104 98 - 111 mmol/L   CO2 20 (L) 22 - 32 mmol/L   Glucose, Bld 166 (H) 70 - 99 mg/dL   BUN 23 8 - 23 mg/dL   Creatinine, Ser 1.01 (H) 0.44 - 1.00 mg/dL   Calcium 9.9 8.9 - 10.3 mg/dL   GFR calc non Af Amer 53 (L) >60 mL/min   GFR calc Af Amer >60 >60 mL/min   Anion gap 12 5 - 15   CBC     Status: None   Collection Time: 07/08/17 10:53 AM  Result Value Ref Range   WBC 9.3 3.6 - 11.0 K/uL   RBC 4.22 3.80 - 5.20 MIL/uL   Hemoglobin 13.7 12.0 - 16.0 g/dL   HCT 39.5 35.0 - 47.0 %   MCV 93.6 80.0 - 100.0 fL   MCH 32.4 26.0 - 34.0 pg   MCHC 34.6 32.0 - 36.0 g/dL   RDW 13.4 11.5 - 14.5 %   Platelets 220 150 - 440 K/uL  Troponin I     Status: None   Collection Time: 07/08/17 10:53 AM  Result Value Ref Range   Troponin I <0.03 <0.03 ng/mL  Lipase, blood     Status: None   Collection Time: 07/08/17 10:53 AM  Result Value Ref Range   Lipase 25 11 - 51 U/L   ____________________________________________  EKG My review and personal interpretation at Time: 10:42   Indication: epigastric pain  Rate: 75  Rhythm: sinus Axis: normal Other: no stemi, normal intervals ____________________________________________  RADIOLOGY  I personally reviewed all radiographic images ordered to evaluate for the above acute complaints and reviewed radiology reports and findings.  These findings were personally discussed with the patient.  Please see medical record for radiology report.  ____________________________________________   PROCEDURES  Procedure(s) performed:  Procedures    Critical Care performed: no ____________________________________________   INITIAL IMPRESSION / ASSESSMENT AND PLAN / ED COURSE  Pertinent labs & imaging results that were available during my care of the patient were reviewed by me and considered in my medical decision making (see chart for details).   DDX: dehydration, gastritis, enteritis, aki, acs, pna, obstruction, vasovagal  Christina Cuevas is a 77 y.o. who presents to the ED with was as described above.  Does appear clinically dehydrated.  Blood work sent for the above differential.  No evidence of dysrhythmia.  Seems less clinically consistent with ACS.  Have discussed with the patient and available family all diagnostics and treatments  performed thus far and all questions were answered to the best of my ability. The patient demonstrates understanding and agreement with plan.   Clinical Course as of Jul 08 1521  Thu Jul 08, 2017  1338 Urinalysis does show evidence of UTI.  No previous culture data.  Patient not septic but do suspect is largely contributing to her presentation.  Will give dose of IV Rocephin and she is also reporting nausea to ensure that she is tolerating medication.   [PR]  1519 Patient reassessed.  Feels much improved after IV hydration.  No nausea.  Repeat troponin is negative.  Patient was able to tolerate PO and was able to ambulate with a steady gait. This point believe she stable and appropriate for outpatient follow-up.   [PR]    Clinical Course User Index [PR] Merlyn Lot, MD     As part of my medical decision making, I reviewed the following data within the [place] notes reviewed and incorporated, Labs reviewed, notes from prior ED visits.   ____________________________________________   FINAL CLINICAL IMPRESSION(S) / ED DIAGNOSES  Final diagnoses:  Dehydration  Acute cystitis with hematuria      NEW MEDICATIONS STARTED DURING THIS VISIT:  New Prescriptions   No medications on file     Note:  This document was prepared using Dragon voice recognition software and may include unintentional dictation errors.    Merlyn Lot, MD 07/08/17 1524

## 2017-07-10 ENCOUNTER — Emergency Department
Admission: EM | Admit: 2017-07-10 | Discharge: 2017-07-10 | Disposition: A | Discharge status: Home / Self Care | Payer: Medicare PPO | Attending: Emergency Medicine | Admitting: Emergency Medicine

## 2017-07-10 DIAGNOSIS — R110 Nausea: Secondary | ICD-10-CM | POA: Insufficient documentation

## 2017-07-10 DIAGNOSIS — Z7982 Long term (current) use of aspirin: Secondary | ICD-10-CM | POA: Diagnosis not present

## 2017-07-10 DIAGNOSIS — E119 Type 2 diabetes mellitus without complications: Secondary | ICD-10-CM | POA: Diagnosis not present

## 2017-07-10 DIAGNOSIS — Z79899 Other long term (current) drug therapy: Secondary | ICD-10-CM | POA: Insufficient documentation

## 2017-07-10 LAB — URINE CULTURE

## 2017-07-10 LAB — CBC WITH DIFFERENTIAL/PLATELET
Basophils Absolute: 0.1 10*3/uL (ref 0–0.1)
Basophils Relative: 1 %
Eosinophils Absolute: 0.1 10*3/uL (ref 0–0.7)
Eosinophils Relative: 1 %
HEMATOCRIT: 40.5 % (ref 35.0–47.0)
HEMOGLOBIN: 13.7 g/dL (ref 12.0–16.0)
LYMPHS ABS: 1.5 10*3/uL (ref 1.0–3.6)
LYMPHS PCT: 19 %
MCH: 31.9 pg (ref 26.0–34.0)
MCHC: 33.9 g/dL (ref 32.0–36.0)
MCV: 94.1 fL (ref 80.0–100.0)
MONOS PCT: 7 %
Monocytes Absolute: 0.5 10*3/uL (ref 0.2–0.9)
NEUTROS ABS: 5.6 10*3/uL (ref 1.4–6.5)
NEUTROS PCT: 72 %
Platelets: 206 10*3/uL (ref 150–440)
RBC: 4.3 MIL/uL (ref 3.80–5.20)
RDW: 13.1 % (ref 11.5–14.5)
WBC: 7.8 10*3/uL (ref 3.6–11.0)

## 2017-07-10 LAB — URINALYSIS, COMPLETE (UACMP) WITH MICROSCOPIC
BACTERIA UA: NONE SEEN
Bilirubin Urine: NEGATIVE
GLUCOSE, UA: NEGATIVE mg/dL
KETONES UR: 80 mg/dL — AB
Leukocytes, UA: NEGATIVE
Nitrite: NEGATIVE
PROTEIN: NEGATIVE mg/dL
Specific Gravity, Urine: 1.021 (ref 1.005–1.030)
pH: 6 (ref 5.0–8.0)

## 2017-07-10 LAB — COMPREHENSIVE METABOLIC PANEL
ALK PHOS: 55 U/L (ref 38–126)
ALT: 20 U/L (ref 0–44)
ANION GAP: 12 (ref 5–15)
AST: 23 U/L (ref 15–41)
Albumin: 4.2 g/dL (ref 3.5–5.0)
BILIRUBIN TOTAL: 0.9 mg/dL (ref 0.3–1.2)
BUN: 21 mg/dL (ref 8–23)
CALCIUM: 9.1 mg/dL (ref 8.9–10.3)
CO2: 18 mmol/L — ABNORMAL LOW (ref 22–32)
Chloride: 106 mmol/L (ref 98–111)
Creatinine, Ser: 0.82 mg/dL (ref 0.44–1.00)
Glucose, Bld: 121 mg/dL — ABNORMAL HIGH (ref 70–99)
Potassium: 3.9 mmol/L (ref 3.5–5.1)
Sodium: 136 mmol/L (ref 135–145)
TOTAL PROTEIN: 7.2 g/dL (ref 6.5–8.1)

## 2017-07-10 LAB — TROPONIN I: Troponin I: <0.03 ng/mL (ref <0.03)

## 2017-07-10 LAB — LIPASE, BLOOD: Lipase: 25 U/L (ref 11–51)

## 2017-07-10 MED ORDER — ONDANSETRON HCL 4 MG/2ML IJ SOLN
4.0000 mg | Freq: Once | INTRAMUSCULAR | Status: AC
  Administered 2017-07-10: 4 mg via INTRAVENOUS
  Filled 2017-07-10: qty 2

## 2017-07-10 MED ORDER — ONDANSETRON HCL 4 MG PO TABS: 4.0000 mg | ORAL_TABLET | Freq: Every day | ORAL | 0 refills | Status: DC | PRN

## 2017-07-10 MED ORDER — SULFAMETHOXAZOLE-TRIMETHOPRIM 800-160 MG PO TABS
1.0000 | ORAL_TABLET | Freq: Two times a day (BID) | ORAL | 0 refills | Status: DC
Start: 2017-07-10 — End: 2018-03-15

## 2017-07-10 MED ORDER — SODIUM CHLORIDE 0.9 % IV BOLUS
1000.0000 mL | Freq: Once | INTRAVENOUS | Status: AC
  Administered 2017-07-10: 1000 mL via INTRAVENOUS

## 2017-07-10 NOTE — ED Provider Notes (Signed)
[facility] Emergency Department Provider Note ____________________________________________   First MD Initiated Contact with Patient 07/10/17 [phone]     (approximate)  I have reviewed the triage vital signs and the nursing notes.   HISTORY  Chief Complaint Nausea  HPI Christina Cuevas is a 77 y.o. female history of diabetes as well as breast cancer, status post mastectomy, who is presenting to the emergency department today with nausea and tremor.  Says that she was seen 2 days ago in the emergency department and diagnosed with a UTI.  She is been on Keflex for the past 2 days.  Despite this, her nausea is gotten worse.  Was given a prescription for at home Zofran but is unsure if she has been using it or not.  Called EMS this morning for worsening sensation of nausea as well as tremor that is now been going on for about an hour.  EMS gave the patient about 100 cc of normal saline as well as Zofran.  Patient denying any pain right now.  Denying any urinary frequency or burning.  No fever reported.  Also reported history in the last note of MS in remission.  Past Medical History:  Diagnosis Date  . Breast cancer ([place])   . Colitis   . Diabetes mellitus without complication ([place])   . Diverticulitis   . Hyperchloremia     Patient Active Problem List   Diagnosis Date Noted  . 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 APPENDECTOMY    . BREAST LUMPECTOMY    . COLONOSCOPY      Prior to Admission medications   Medication Sig Start Date End Date Taking? Authorizing Provider  anastrozole (ARIMIDEX) 1 MG tablet Take 1 tablet by mouth daily. 03/05/14   [provider]  aspirin EC 81 MG tablet Take 1 tablet by mouth daily.    [provider]  cephALEXin (KEFLEX) 500 MG capsule Take 1 capsule (500 mg total) by mouth 3 (three) times daily. 12/18/16   Nance Pear, MD  cephALEXin (KEFLEX) 500  MG capsule Take 1 capsule (500 mg total) by mouth 3 (three) times daily for 7 days. 07/08/17 07/15/17  Merlyn Lot, MD  cetirizine (ZYRTEC) 10 MG tablet Take 1 tablet by mouth daily.    [provider]  cetirizine (ZYRTEC) 10 MG tablet Take by mouth.    [provider]  Cholecalciferol (VITAMIN D-1000 MAX ST) 1000 UNITS tablet Take 2 tablets by mouth 2 (two) times daily.    [provider]  Cinnamon 500 MG capsule Take 2 capsules by mouth daily.    [provider]  dicyclomine (BENTYL) 20 MG tablet Take 1 tablet (20 mg total) by mouth 3 (three) times daily as needed (abdominal pain). 12/18/16   Nance Pear, MD  levothyroxine (SYNTHROID, LEVOTHROID) 88 MCG tablet Take 1 tablet by mouth daily. 07/20/14   [provider]  lisinopril (PRINIVIL,ZESTRIL) 10 MG tablet Take 1 tablet by mouth daily. 09/24/14   [provider]  loperamide (IMODIUM A-D) 2 MG tablet Take 1 tablet (2 mg total) by mouth 4 (four) times daily as needed. 12/28/14   Eula Listen, MD  metoCLOPramide (REGLAN) 10 MG tablet Take 1 tablet (10 mg total) by mouth every 6 (six) hours as needed for nausea. 07/08/17 07/08/18  Merlyn Lot, MD  ondansetron (ZOFRAN) 4 MG tablet Take 1 tablet (4 mg total) by mouth every 8 (eight) hours as needed  for nausea or vomiting. 12/18/16   Nance Pear, MD  pantoprazole (PROTONIX) 40 MG tablet Take 1 tablet by mouth daily. 09/24/14   [provider]  pregabalin (LYRICA) 75 MG capsule Take 75 mg by mouth 2 (two) times daily.    [provider]  Simethicone 80 MG TABS Take 375 mg by mouth every morning.    [provider]  simvastatin (ZOCOR) 10 MG tablet Take 0.5 tablets by mouth every evening.  11/01/14   [provider]    Allergies Ciprofloxacin and Metronidazole  Family History  Problem Relation Age of Onset  . Parkinson's disease Mother   . Pancreatic cancer Father   . Cancer Paternal  Uncle        2 uncles   . Migraines Maternal Grandmother   . Breast cancer Cousin     Social History Social History   Tobacco Use  . Smoking status: Never Smoker  . Smokeless tobacco: Never Used  Substance Use Topics  . Alcohol use: No  . Drug use: No    Review of Systems  Constitutional: No fever/chills Eyes: No visual changes. ENT: No sore throat. Cardiovascular: Denies chest pain. Respiratory: Denies shortness of breath. Gastrointestinal: No abdominal pain.  No nausea, no vomiting.  No diarrhea.  No constipation. Genitourinary: Negative for dysuria. Musculoskeletal: Negative for back pain. Skin: Negative for rash. Neurological: Negative for headaches, focal weakness or numbness.   ____________________________________________   PHYSICAL EXAM:  VITAL SIGNS: ED Triage Vitals [07/10/17 0949]  Enc Vitals Group     BP      Pulse      Resp      Temp      Temp src      SpO2 100 %     Weight 142 lb (64.4 kg)     Height 5\' 2"  (1.575 m)     Head Circumference      Peak Flow      Pain Score 0     Pain Loc      Pain Edu?      Excl. in [place]?     Constitutional: Alert and oriented.  Patient appears uncomfortable.  Presents with a damp towel of her face and eyes. Eyes: Conjunctivae are normal.  Head: Atraumatic. Nose: No congestion/rhinnorhea. Mouth/Throat: Mucous membranes are moist.  Neck: No stridor.   Cardiovascular: Normal rate, regular rhythm. Grossly normal heart sounds.   Respiratory: Normal respiratory effort.  No retractions. Lungs CTAB. Gastrointestinal: Soft and nontender. No distention. No CVA tenderness. Musculoskeletal: No lower extremity tenderness nor edema.  No joint effusions. Neurologic:  Normal speech and language. No gross focal neurologic deficits are appreciated. Skin:  Skin is warm, dry and intact. No rash noted. Psychiatric: Mood and affect are normal. Speech and behavior are normal.  ____________________________________________    LABS (all labs ordered are listed, but only abnormal results are displayed)  Labs Reviewed  COMPREHENSIVE METABOLIC PANEL - Abnormal; Notable for the following components:      Result Value   CO2 18 (*)    Glucose, Bld 121 (*)    All other components within normal limits  URINALYSIS, COMPLETE (UACMP) WITH MICROSCOPIC - Abnormal; Notable for the following components:   Color, Urine YELLOW (*)    APPearance CLEAR (*)    Hgb urine dipstick MODERATE (*)    Ketones, ur 80 (*)    All other components within normal limits  URINE CULTURE  TROPONIN I  LIPASE, BLOOD  CBC WITH DIFFERENTIAL/PLATELET  ____________________________________________  EKG  ED ECG REPORT I, Doran Stabler, the attending physician, personally viewed and interpreted this ECG.   Date: 07/10/2017  EKG Time: 0954  Rate: 61  Rhythm: normal sinus rhythm  Axis: Normal  Intervals:none  ST&T Change: No ST segment elevation or depression.  No abnormal T wave inversion.  ____________________________________________  RADIOLOGY  ____________________________________________   PROCEDURES  Procedure(s) performed:   Procedures  Critical Care performed:   ____________________________________________   INITIAL IMPRESSION / ASSESSMENT AND PLAN / ED COURSE  Pertinent labs & imaging results that were available during my care of the patient were reviewed by me and considered in my medical decision making (see chart for details).  DDX: UTI, uremia, sepsis, medication reaction, MS, kidney failure, abnormal electrolytes As part of my medical decision making, I reviewed the following data within the [place] Notes from prior ED visits.  I also reviewed the patient's culture which showed an E. coli bacteria that was growing that was sensitive to cephalosporins.  ----------------------------------------- 11:33 AM on 07/10/2017 -----------------------------------------  Patient with reassuring  lab work today with urine that does not appear to be infected at this time.  However, she is saying that "my medication is not working."  She is requesting the medication that Dr. Netty Starring gave her back in October 2018 which was Bactrim.  She is also prescribed metoclopramide several days ago which may be causing her to shake.  I told her that she should stop the metoclopramide.  She is wanting to switch from Keflex to Bactrim even though the Keflex appears to be working for the infection.  I will give her 5 days of Bactrim.  Patient able to tolerate fluids.  Says that she has not vomited but has felt nausea and stomach acid, sore throat.  However, has been able to tolerate fluids.  Will be discharged at this time.  Will follow with Dr. Netty Starring. ____________________________________________   FINAL CLINICAL IMPRESSION(S) / ED DIAGNOSES  Nausea.    NEW MEDICATIONS STARTED DURING THIS VISIT:  New Prescriptions   No medications on file     Note:  This document was prepared using Dragon voice recognition software and may include unintentional dictation errors.     Orbie Pyo, MD 07/10/17 1134

## 2017-07-10 NOTE — ED Triage Notes (Signed)
Pt presents today from home via [facility] for Nausea. Pt was dx with UTI 2 days ago and has been on keflex and Reglan. Pt states she has not been able to eat. Pt is NAD and EDP at bedside.

## 2017-07-10 NOTE — ED Notes (Signed)
Pt is requesting to see EDP. EDP aware at this time.

## 2017-07-11 LAB — URINE CULTURE: CULTURE: NO GROWTH

## 2017-07-23 ENCOUNTER — Ambulatory Visit: Payer: Medicare PPO | Admitting: Obstetrics & Gynecology

## 2017-07-26 ENCOUNTER — Ambulatory Visit: Payer: Medicare PPO | Admitting: Obstetrics & Gynecology

## 2017-07-28 ENCOUNTER — Ambulatory Visit: Payer: Medicare PPO | Admitting: Obstetrics & Gynecology

## 2017-07-28 ENCOUNTER — Encounter: Payer: Self-pay | Admitting: Obstetrics & Gynecology

## 2017-07-28 VITALS — BP 130/78 | HR 65 | Ht 62.0 in | Wt 150.0 lb

## 2017-07-28 DIAGNOSIS — N814 Uterovaginal prolapse, unspecified: Secondary | ICD-10-CM | POA: Diagnosis not present

## 2017-07-28 DIAGNOSIS — R32 Unspecified urinary incontinence: Secondary | ICD-10-CM | POA: Diagnosis not present

## 2017-07-28 NOTE — Progress Notes (Signed)
  HPI:      [name] [phone] who presents today for her pessary follow up and examination related to her pelvic floor weakening.  She has been having more incontinence sx's of late.  Plan is to exchange ring pessary for Incontinence ring pessary.  No recent bleeding discharge or pain.  No recent expulsion.  PMHx: She  has a past medical history of Breast cancer ([place]), Colitis, Diabetes mellitus without complication ([place]), Diverticulitis, and Hyperchloremia. Also,  has a past surgical history that includes Appendectomy; Colonoscopy; and Breast lumpectomy., family history includes Breast cancer in her cousin; Cancer in her paternal uncle; Migraines in her maternal grandmother; Pancreatic cancer in her father; Parkinson's disease in her mother.,  reports that she has never smoked. She has never used smokeless tobacco. She reports that she does not drink alcohol or use drugs.  She has a current medication list which includes the following prescription(s): anastrozole, aspirin ec, cephalexin, cetirizine, cetirizine, cholecalciferol, cinnamon, dicyclomine, duloxetine, levothyroxine, lisinopril, loperamide, metoclopramide, ondansetron, pantoprazole, pregabalin, simethicone, simvastatin, and sulfamethoxazole-trimethoprim. Also, is allergic to cephalexin; ciprofloxacin; and metronidazole.  Review of Systems  All other systems reviewed and are negative.  Objective: BP 130/78   Pulse 65   Ht 5\' 2"  (1.575 m)   Wt 150 lb (68 kg)   BMI 27.44 kg/m  Physical Exam  Constitutional: She is oriented to person, place, and time. She appears well-developed and well-nourished. No distress.  Genitourinary: Vagina normal and uterus normal. Pelvic exam was performed with patient supine. There is no rash, tenderness or lesion on the right labia. There is no rash, tenderness or lesion on the left labia. No erythema or bleeding in the vagina. Right adnexum does not display mass and does not display  tenderness. Left adnexum does not display mass and does not display tenderness. Cervix does not exhibit motion tenderness, discharge, polyp or nabothian cyst.   Uterus is mobile and midaxial. Uterus is not enlarged or exhibiting a mass.  Genitourinary Comments: Cystocele and vag floor weakening  HENT:  Head: Normocephalic and atraumatic.  Nose: Nose normal.  Mouth/Throat: Oropharynx is clear and moist.  Abdominal: Soft. She exhibits no distension. There is no tenderness.  Musculoskeletal: Normal range of motion.  Neurological: She is alert and oriented to person, place, and time. No cranial nerve deficit.  Skin: Skin is warm and dry.  Psychiatric: She has a normal mood and affect.   Pessary Care Pessary removed and cleaned.  Vagina checked - without erosions - new pessary replaced.  A/P:   1. Cystocele with uterine descensus           2. Incontinence of urine in female  Pessary was cleaned and Incontinence ring #5 placed today. Instructions given for care. Concerning symptoms to observe for are counseled to patient. Follow up scheduled for 3 months.  A total of 15 minutes were spent face-to-face with the patient during this encounter and over half of that time dealt with counseling and coordination of care.  Barnett Applebaum, MD, [facility], [facility] 07/28/2017  3:29 PM

## 2017-10-29 ENCOUNTER — Ambulatory Visit: Payer: Medicare PPO | Admitting: Obstetrics & Gynecology

## 2017-10-29 ENCOUNTER — Encounter: Payer: Self-pay | Admitting: Obstetrics & Gynecology

## 2017-10-29 VITALS — BP 100/60 | Ht 62.0 in | Wt 151.0 lb

## 2017-10-29 DIAGNOSIS — N814 Uterovaginal prolapse, unspecified: Secondary | ICD-10-CM

## 2017-10-29 DIAGNOSIS — R32 Unspecified urinary incontinence: Secondary | ICD-10-CM

## 2017-10-29 NOTE — Progress Notes (Signed)
  HPI:      [name] [phone] who presents today for her pessary follow up and examination related to her pelvic floor weakening.  Pt reports tolerating the pessary well with  no vaginal bleeding and  no vaginal discharge. The Incontinence ring fell out after 6 weeks of use, and she personally replaced her regular ring pessary to get her through to today.  Symptoms of pelvic floor weakening have greatly improved with the ring, but she has considerable incontinence (hence need to trial incontinence ring). She is voiding and defecating without difficulty. She currently has a #6 ring pessary.  PMHx: She  has a past medical history of Breast cancer ([place]), Colitis, Diabetes mellitus without complication ([place]), Diverticulitis, and Hyperchloremia. Also,  has a past surgical history that includes Appendectomy; Colonoscopy; and Breast lumpectomy., family history includes Breast cancer in her cousin; Cancer in her paternal uncle; Migraines in her maternal grandmother; Pancreatic cancer in her father; Parkinson's disease in her mother.,  reports that she has never smoked. She has never used smokeless tobacco. She reports that she does not drink alcohol or use drugs.  She has a current medication list which includes the following prescription(s): anastrozole, aspirin ec, cephalexin, cetirizine, cetirizine, cholecalciferol, cinnamon, dicyclomine, duloxetine, levothyroxine, lisinopril, loperamide, metoclopramide, ondansetron, pantoprazole, pregabalin, simethicone, simvastatin, and sulfamethoxazole-trimethoprim. Also, is allergic to cephalexin; ciprofloxacin; and metronidazole.  Review of Systems  All other systems reviewed and are negative.  Objective: BP 100/60   Ht 5\' 2"  (1.575 m)   Wt 151 lb (68.5 kg)   BMI 27.62 kg/m  Physical Exam  Constitutional: She is oriented to person, place, and time. She appears well-developed and well-nourished. No distress.  Genitourinary: Vagina normal and  uterus normal. Pelvic exam was performed with patient supine. There is no rash, tenderness or lesion on the right labia. There is no rash, tenderness or lesion on the left labia. No erythema or bleeding in the vagina. Right adnexum does not display mass and does not display tenderness. Left adnexum does not display mass and does not display tenderness. Cervix does not exhibit motion tenderness, discharge, polyp or nabothian cyst.   Uterus is mobile and midaxial. Uterus is not enlarged or exhibiting a mass.  Genitourinary Comments: Cystocele and uterine prolapse +3  HENT:  Head: Normocephalic and atraumatic.  Nose: Nose normal.  Mouth/Throat: Oropharynx is clear and moist.  Abdominal: Soft. She exhibits no distension. There is no tenderness.  Musculoskeletal: Normal range of motion.  Neurological: She is alert and oriented to person, place, and time. No cranial nerve deficit.  Skin: Skin is warm and dry.  Psychiatric: She has a normal mood and affect.   Pessary Care Pessary removed and cleaned.  Vagina checked - without erosions - pessary replaced.  A/P:1. Cystocele with uterine descensus           2. Incontinence of urine in female  Pessary- Ring #6-  was cleaned and replaced today. Will order Incontinence Ring #6 for next visit to trial. Instructions given for care. Concerning symptoms to observe for are counseled to patient. Follow up scheduled for 3 months.  A total of 15 minutes were spent face-to-face with the patient during this encounter and over half of that time dealt with counseling and coordination of care.  Barnett Applebaum, MD, [facility], [facility] 10/29/2017  1:59 PM

## 2017-12-20 DIAGNOSIS — E1142 Type 2 diabetes mellitus with diabetic polyneuropathy: Secondary | ICD-10-CM | POA: Diagnosis present

## 2017-12-20 DIAGNOSIS — E119 Type 2 diabetes mellitus without complications: Secondary | ICD-10-CM

## 2017-12-20 HISTORY — DX: Type 2 diabetes mellitus without complications: E11.9

## 2018-01-31 ENCOUNTER — Encounter: Payer: Self-pay | Admitting: Obstetrics & Gynecology

## 2018-01-31 ENCOUNTER — Ambulatory Visit: Payer: Medicare HMO | Admitting: Obstetrics & Gynecology

## 2018-01-31 VITALS — BP 110/60 | Ht 62.0 in | Wt 154.0 lb

## 2018-01-31 DIAGNOSIS — R32 Unspecified urinary incontinence: Secondary | ICD-10-CM

## 2018-01-31 DIAGNOSIS — N814 Uterovaginal prolapse, unspecified: Secondary | ICD-10-CM

## 2018-01-31 NOTE — Progress Notes (Signed)
  HPI:      [name] is a 78 y.o. [phone] who presents today for her pessary follow up and examination related to her pelvic floor weakening.  Pt reports tolerating the pessary well with  no vaginal bleeding and  no vaginal discharge.  Symptoms of pelvic floor weakening have greatly improved. She is voiding and defecating without difficulty.  She still has some regular episodes of incontinence.   She currently has a RING #6 w SUPPORT pessary.  PMHx: She  has a past medical history of Breast cancer ([place]), Colitis, Diabetes mellitus without complication ([place]), Diverticulitis, and Hyperchloremia. Also,  has a past surgical history that includes Appendectomy; Colonoscopy; and Breast lumpectomy., family history includes Breast cancer in her cousin; Cancer in her paternal uncle; Migraines in her maternal grandmother; Pancreatic cancer in her father; Parkinson's disease in her mother.,  reports that she has never smoked. She has never used smokeless tobacco. She reports that she does not drink alcohol or use drugs.  She has a current medication list which includes the following prescription(s): anastrozole, aspirin ec, cephalexin, cetirizine, cetirizine, cholecalciferol, cinnamon, dicyclomine, duloxetine, levothyroxine, lisinopril, loperamide, metoclopramide, ondansetron, pantoprazole, pregabalin, simethicone, simvastatin, and sulfamethoxazole-trimethoprim. Also, is allergic to cephalexin; ciprofloxacin; and metronidazole.  Review of Systems  All other systems reviewed and are negative.   Objective: BP 110/60   Ht 5\' 2"  (1.575 m)   Wt 154 lb (69.9 kg)   BMI 28.17 kg/m  Physical Exam Constitutional:      General: She is not in acute distress.    Appearance: She is well-developed.  Genitourinary:     Pelvic exam was performed with patient supine.     Vagina and uterus normal.     No vaginal erythema or bleeding.     No cervical motion tenderness, discharge, polyp or nabothian cyst.   Uterus is mobile.     Uterus is not enlarged or tender.     No uterine mass detected.    Uterus is midaxial and regular.     No right or left adnexal mass present.     Right adnexa not tender.     Left adnexa not tender.     Genitourinary Comments: Gr 3 uterine prolapse and cystocele Gr 1 rectocele   HENT:     Head: Normocephalic and atraumatic.     Nose: Nose normal.  Abdominal:     General: There is no distension.     Palpations: Abdomen is soft.     Tenderness: There is no abdominal tenderness.  Musculoskeletal: Normal range of motion.  Neurological:     Mental Status: She is alert and oriented to person, place, and time.     Cranial Nerves: No cranial nerve deficit.  Skin:    General: Skin is warm and dry.   Pessary Care Pessary removed and cleaned.  Vagina checked - without erosions - pessary replaced.  A/P:1. Cystocele with uterine descensus 2. Incontinence of urine in female  Pessary was cleaned and replaced today. Instructions given for care. Concerning symptoms to observe for are counseled to patient. Plan trial of INCONTINENCE RING (#6) next visit to see if maintains prolapse sx's but adds to incontinence relief  Follow up scheduled for 3 months.  A total of 15 minutes were spent face-to-face with the patient during this encounter and over half of that time dealt with counseling and coordination of care.  Barnett Applebaum, MD, [facility], [facility] 01/31/2018  1:47 PM

## 2018-02-04 ENCOUNTER — Telehealth: Payer: Self-pay | Admitting: Obstetrics & Gynecology

## 2018-02-04 NOTE — Telephone Encounter (Signed)
Patient prefers to wait until schedule appointment in April. Patient will call back if she changes her mind and needs to be seen for a more sooner appointment

## 2018-02-04 NOTE — Telephone Encounter (Signed)
-----   Message from Quintella Baton, [place] sent at 02/04/2018  8:24 AM EST ----- It was on my desk ----- Message ----- From: Quintella Baton, [place] Sent: 01/31/2018   1:48 PM EST To: Otila Kluver, LPN Subject: pessary                                        [facility] wants to order a #6 incontinence ring with support.

## 2018-02-04 NOTE — Telephone Encounter (Signed)
Called and left voice mail for patient to call back to be schedule °

## 2018-03-09 ENCOUNTER — Other Ambulatory Visit: Payer: Self-pay | Admitting: Podiatry

## 2018-03-17 ENCOUNTER — Other Ambulatory Visit: Payer: Self-pay

## 2018-03-17 ENCOUNTER — Other Ambulatory Visit: Payer: Medicare PPO

## 2018-03-17 ENCOUNTER — Encounter
Admission: RE | Admit: 2018-03-17 | Discharge: 2018-03-17 | Disposition: A | Discharge status: Home / Self Care | Payer: Medicare HMO | Source: Ambulatory Visit | Attending: Podiatry | Admitting: Podiatry

## 2018-03-17 DIAGNOSIS — I10 Essential (primary) hypertension: Secondary | ICD-10-CM | POA: Diagnosis not present

## 2018-03-17 DIAGNOSIS — Z01818 Encounter for other preprocedural examination: Secondary | ICD-10-CM | POA: Insufficient documentation

## 2018-03-17 DIAGNOSIS — E119 Type 2 diabetes mellitus without complications: Secondary | ICD-10-CM | POA: Insufficient documentation

## 2018-03-17 HISTORY — DX: Hypothyroidism, unspecified: E03.9

## 2018-03-17 HISTORY — DX: Chronic kidney disease, unspecified: N18.9

## 2018-03-17 HISTORY — DX: Essential (primary) hypertension: I10

## 2018-03-17 HISTORY — DX: Prediabetes: R73.03

## 2018-03-17 LAB — CBC
HCT: 39 % (ref 36.0–46.0)
Hemoglobin: 12.5 g/dL (ref 12.0–15.0)
MCH: 31.2 pg (ref 26.0–34.0)
MCHC: 32.1 g/dL (ref 30.0–36.0)
MCV: 97.3 fL (ref 80.0–100.0)
PLATELETS: 215 10*3/uL (ref 150–400)
RBC: 4.01 MIL/uL (ref 3.87–5.11)
RDW: 12.3 % (ref 11.5–15.5)
WBC: 7.4 10*3/uL (ref 4.0–10.5)
nRBC: 0 % (ref 0.0–0.2)

## 2018-03-17 LAB — BASIC METABOLIC PANEL
Anion gap: 5 (ref 5–15)
BUN: 21 mg/dL (ref 8–23)
CHLORIDE: 107 mmol/L (ref 98–111)
CO2: 27 mmol/L (ref 22–32)
Calcium: 8.7 mg/dL — ABNORMAL LOW (ref 8.9–10.3)
Creatinine, Ser: 1.14 mg/dL — ABNORMAL HIGH (ref 0.44–1.00)
GFR calc Af Amer: 54 mL/min — ABNORMAL LOW (ref >60)
GFR, EST NON AFRICAN AMERICAN: 47 mL/min — AB (ref >60)
GLUCOSE: 100 mg/dL — AB (ref 70–99)
POTASSIUM: 4.2 mmol/L (ref 3.5–5.1)
Sodium: 139 mmol/L (ref 135–145)

## 2018-03-17 NOTE — Patient Instructions (Addendum)
INSTRUCTIONS FOR SURGERY     Your surgery is scheduled for:  Friday, MARCH 13,2020     To find out your arrival time for the day of surgery,          please call [phone] between 1 pm and 3 pm on : Thursday, March 24, 2018     When you arrive for surgery, report to the [facility].       Do NOT stop on the first floor to register.    REMEMBER: Instructions that are not followed completely may result in serious medical risk,  up to and including death, or upon the discretion of your surgeon and anesthesiologist,            your surgery may need to be rescheduled.  __X__ 1. Do not eat food after midnight the night before your procedure.                    No gum, candy, lozenger, tic tacs, tums or hard candies.                  ABSOLUTELY NOTHING SOLID IN YOUR MOUTH AFTER MIDNIGHT                    You may drink unlimited clear liquids up to 2 hours before you are scheduled                       to arrive for surgery. Do not drink anything within those 2 hours unless                      You need to take medicine, then take the smallest amount you need.                  Clear liquids include:  water, apple juice without pulp, any flavor Gatorade,                        Black coffee, black tea.  Sugar may be added but no dairy/ honey /lemon.                        Broth and jello is not considered a clear liquid.  __x__  2. On the morning of surgery, please brush your teeth with toothpaste and water.                    You may rinse with mouthwash if you wish but DO NOT SWALLOW TOOTHPASTE OR MOUTHWASH  __X___3. NO alcohol for 24 hours before or after surgery.  __x___ 4.  Do NOT smoke or use e-cigarettes for 24 HOURS PRIOR TO SURGERY.                      DO NOT Use any chewable tobacco products for at least 6 hours prior to surgery.  __x___ 5. If you start any new medication after this appointment and  prior to surgery, please  Bring it with you on the day of surgery.  ___x__ 6. Notify your doctor if there is any change in your medical condition, such as fever, infection, vomitting, diarrhea.  __x___ 7.  USE the CHG SOAP as instructed, the night before surgery and the day of surgery.                    Once you have washed with this soap, do NOT use any of the following:                      Powders, perfumes, lotions, make up, hairpins, clips or nail polish.                     You _MAY_ wear deodorant.                      Men may shave their face and neck.  Women need to shave 48 hours prior to surgery.                      DO NOT wear ANY jewelry on the day of surgery. If there are rings that are too tight to remove easily,                         please address this prior to the surgery day. Piercings need to be removed.   NO METAL ON YOUR BODY.                     Do NOT bring any valuables.  If you came to Pre-Admit testing then you will not need license, insurance card or credit card.                       If you will be staying overnight, please either leave your things in the car or have your family be responsible for this.                      [facility] IS NOT RESPONSIBLE FOR BELONGINGS OR VALUABLES.  ___X__ 8. DO NOT wear contact lenses on surgery day.  You may not have dentures,                     Hearing aides, contacts or glasses in the operating room. These items can be                    Placed in the Recovery Room to receive immediately after surgery.  __x___ 9. IF YOU ARE SCHEDULED TO GO HOME ON THE SAME DAY, YOU MUST                   Have someone to drive you home and to stay with you  for the first 24 hours.                    Have an arrangement prior to arriving on surgery day.  _____ 10. Take the following medications on the morning of surgery with a sip of water:                              1. PROTONIX.Marland KitchenTAKE THE NIGHT BEFORE SURGERY AND THE  MORNING OF SURGERY  2. ZYRTEC                     3. CYMBALTA                     4.SYNTHROID                     5.                     6.  _____ 11.  Follow any instructions provided to you by your surgeon.                        Such as enema, clear liquid bowel prep  __X__  12. STOP/ ASPIRIN AS YB:OFBPZ                       THIS INCLUDES BC POWDERS / GOODIES POWDER  __x___ 13. STOP Anti-inflammatories as of: March 18, 2018                      This includes IBUPROFEN / MOTRIN / ADVIL / ALEVE/ NAPROXYN                    YOU MAY TAKE TYLENOL ANY TIME PRIOR TO SURGERY.  _____ 14.  Stop supplements until after surgery.                     This includes: CINNAMON                  You may continue taking Vitamin B12 / Vitamin D3 but do not take on the                   Morning of surgery.  _____ 15. Bring your CPAP machine into preop with you on the morning of surgery.  ______16.  Stop Metformin 2 full days prior to surgery.  Stop on:N/A  ______17.  Continue to take the following medications but do not take on the morning                      Of surgery: MYLICON / VITAMIN D3  ______02. If staying overnight, please have appropriate shoes to wear to be able to                      Walk around the unit.  WEAR A LOOSE FITTING PANT SO YOUR FOOT BANDAGE CAN EASILY FIT INSIDE

## 2018-03-17 NOTE — Pre-Procedure Instructions (Signed)
Provided patient with an incentive spirometer and instructions. She was able to successfully provide a return demonstration to use once home.

## 2018-03-18 NOTE — Pre-Procedure Instructions (Signed)
Met B results sent to Dr. Fowler for review. 

## 2018-03-24 MED ORDER — CLINDAMYCIN PHOSPHATE 900 MG/50ML IV SOLN
900.0000 mg | INTRAVENOUS | Status: AC
  Administered 2018-03-25: 900 mg via INTRAVENOUS

## 2018-03-25 ENCOUNTER — Ambulatory Visit: Payer: Medicare HMO | Admitting: Anesthesiology

## 2018-03-25 ENCOUNTER — Other Ambulatory Visit: Payer: Self-pay

## 2018-03-25 ENCOUNTER — Ambulatory Visit
Admission: RE | Admit: 2018-03-25 | Discharge: 2018-03-25 | Disposition: A | Discharge status: Home / Self Care | Payer: Medicare HMO | Attending: Podiatry | Admitting: Podiatry

## 2018-03-25 ENCOUNTER — Encounter
Admission: RE | Disposition: A | Discharge status: Home / Self Care | Payer: Self-pay | Source: Home / Self Care | Attending: Podiatry

## 2018-03-25 DIAGNOSIS — Z881 Allergy status to other antibiotic agents status: Secondary | ICD-10-CM | POA: Insufficient documentation

## 2018-03-25 DIAGNOSIS — Z79811 Long term (current) use of aromatase inhibitors: Secondary | ICD-10-CM | POA: Diagnosis not present

## 2018-03-25 DIAGNOSIS — C50912 Malignant neoplasm of unspecified site of left female breast: Secondary | ICD-10-CM | POA: Diagnosis not present

## 2018-03-25 DIAGNOSIS — G35 Multiple sclerosis: Secondary | ICD-10-CM | POA: Insufficient documentation

## 2018-03-25 DIAGNOSIS — Z853 Personal history of malignant neoplasm of breast: Secondary | ICD-10-CM | POA: Insufficient documentation

## 2018-03-25 DIAGNOSIS — K219 Gastro-esophageal reflux disease without esophagitis: Secondary | ICD-10-CM | POA: Diagnosis not present

## 2018-03-25 DIAGNOSIS — Z85820 Personal history of malignant melanoma of skin: Secondary | ICD-10-CM | POA: Diagnosis not present

## 2018-03-25 DIAGNOSIS — Z79899 Other long term (current) drug therapy: Secondary | ICD-10-CM | POA: Insufficient documentation

## 2018-03-25 DIAGNOSIS — I10 Essential (primary) hypertension: Secondary | ICD-10-CM | POA: Diagnosis not present

## 2018-03-25 DIAGNOSIS — Z7982 Long term (current) use of aspirin: Secondary | ICD-10-CM | POA: Insufficient documentation

## 2018-03-25 DIAGNOSIS — M8580 Other specified disorders of bone density and structure, unspecified site: Secondary | ICD-10-CM | POA: Insufficient documentation

## 2018-03-25 DIAGNOSIS — E039 Hypothyroidism, unspecified: Secondary | ICD-10-CM | POA: Insufficient documentation

## 2018-03-25 DIAGNOSIS — E1143 Type 2 diabetes mellitus with diabetic autonomic (poly)neuropathy: Secondary | ICD-10-CM | POA: Diagnosis not present

## 2018-03-25 DIAGNOSIS — E559 Vitamin D deficiency, unspecified: Secondary | ICD-10-CM | POA: Insufficient documentation

## 2018-03-25 DIAGNOSIS — E7800 Pure hypercholesterolemia, unspecified: Secondary | ICD-10-CM | POA: Insufficient documentation

## 2018-03-25 DIAGNOSIS — Z79890 Hormone replacement therapy (postmenopausal): Secondary | ICD-10-CM | POA: Insufficient documentation

## 2018-03-25 DIAGNOSIS — M2012 Hallux valgus (acquired), left foot: Secondary | ICD-10-CM | POA: Diagnosis present

## 2018-03-25 HISTORY — PX: BUNIONECTOMY: SHX129

## 2018-03-25 LAB — GLUCOSE, CAPILLARY: Glucose-Capillary: 103 mg/dL — ABNORMAL HIGH (ref 70–99)

## 2018-03-25 SURGERY — BUNIONECTOMY
Anesthesia: General | Site: Foot | Laterality: Left

## 2018-03-25 MED ORDER — LIDOCAINE HCL (PF) 1 % IJ SOLN
INTRAMUSCULAR | Status: AC
  Filled 2018-03-25: qty 30

## 2018-03-25 MED ORDER — BUPIVACAINE LIPOSOME 1.3 % IJ SUSP
INTRAMUSCULAR | Status: DC | PRN
  Administered 2018-03-25: 8 mL
  Administered 2018-03-25: 10 mL

## 2018-03-25 MED ORDER — LACTATED RINGERS IV SOLN
INTRAVENOUS | Status: DC | PRN
  Administered 2018-03-25: 08:00:00 via INTRAVENOUS

## 2018-03-25 MED ORDER — OXYCODONE-ACETAMINOPHEN 5-325 MG PO TABS: 1.0000 | ORAL_TABLET | ORAL | 0 refills | Status: DC | PRN

## 2018-03-25 MED ORDER — EPHEDRINE SULFATE 50 MG/ML IJ SOLN
INTRAMUSCULAR | Status: DC | PRN
  Administered 2018-03-25 (×2): 10 mg via INTRAVENOUS
  Administered 2018-03-25: 5 mg via INTRAVENOUS

## 2018-03-25 MED ORDER — PROPOFOL 10 MG/ML IV BOLUS
INTRAVENOUS | Status: AC
  Filled 2018-03-25: qty 40

## 2018-03-25 MED ORDER — POVIDONE-IODINE 7.5 % EX SOLN
Freq: Once | CUTANEOUS | Status: DC
  Filled 2018-03-25: qty 118

## 2018-03-25 MED ORDER — DEXAMETHASONE SODIUM PHOSPHATE 10 MG/ML IJ SOLN
INTRAMUSCULAR | Status: DC | PRN
  Administered 2018-03-25: 4 mg via INTRAVENOUS

## 2018-03-25 MED ORDER — PHENYLEPHRINE HCL 10 MG/ML IJ SOLN
INTRAMUSCULAR | Status: DC | PRN
  Administered 2018-03-25 (×3): 100 ug via INTRAVENOUS

## 2018-03-25 MED ORDER — LIDOCAINE HCL (CARDIAC) PF 100 MG/5ML IV SOSY
PREFILLED_SYRINGE | INTRAVENOUS | Status: DC | PRN
  Administered 2018-03-25: 80 mg via INTRAVENOUS

## 2018-03-25 MED ORDER — LIDOCAINE HCL (PF) 2 % IJ SOLN
INTRAMUSCULAR | Status: AC
  Filled 2018-03-25: qty 20

## 2018-03-25 MED ORDER — GLYCOPYRROLATE 0.2 MG/ML IJ SOLN
INTRAMUSCULAR | Status: DC | PRN
  Administered 2018-03-25: 0.2 mg via INTRAVENOUS

## 2018-03-25 MED ORDER — FENTANYL CITRATE (PF) 100 MCG/2ML IJ SOLN
INTRAMUSCULAR | Status: AC
  Filled 2018-03-25: qty 2

## 2018-03-25 MED ORDER — SODIUM CHLORIDE 0.9 % IV SOLN: INTRAVENOUS | Status: DC

## 2018-03-25 MED ORDER — ONDANSETRON HCL 4 MG PO TABS: 4.0000 mg | ORAL_TABLET | Freq: Four times a day (QID) | ORAL | Status: DC | PRN

## 2018-03-25 MED ORDER — ONDANSETRON HCL 4 MG/2ML IJ SOLN: 4.0000 mg | Freq: Once | INTRAMUSCULAR | Status: DC | PRN

## 2018-03-25 MED ORDER — BUPIVACAINE HCL 0.5 % IJ SOLN
INTRAMUSCULAR | Status: DC | PRN
  Administered 2018-03-25: 10 mL

## 2018-03-25 MED ORDER — BUPIVACAINE HCL (PF) 0.5 % IJ SOLN
INTRAMUSCULAR | Status: AC
  Filled 2018-03-25: qty 30

## 2018-03-25 MED ORDER — SEVOFLURANE IN SOLN
RESPIRATORY_TRACT | Status: AC
  Filled 2018-03-25: qty 250

## 2018-03-25 MED ORDER — BUPIVACAINE LIPOSOME 1.3 % IJ SUSP
INTRAMUSCULAR | Status: AC
  Filled 2018-03-25: qty 20

## 2018-03-25 MED ORDER — FENTANYL CITRATE (PF) 100 MCG/2ML IJ SOLN: 25.0000 ug | INTRAMUSCULAR | Status: DC | PRN

## 2018-03-25 MED ORDER — ONDANSETRON HCL 4 MG/2ML IJ SOLN: 4.0000 mg | Freq: Four times a day (QID) | INTRAMUSCULAR | Status: DC | PRN

## 2018-03-25 MED ORDER — FENTANYL CITRATE (PF) 100 MCG/2ML IJ SOLN
INTRAMUSCULAR | Status: DC | PRN
  Administered 2018-03-25: 100 ug via INTRAVENOUS

## 2018-03-25 MED ORDER — ONDANSETRON HCL 4 MG/2ML IJ SOLN
INTRAMUSCULAR | Status: DC | PRN
  Administered 2018-03-25: 4 mg via INTRAVENOUS

## 2018-03-25 MED ORDER — CLINDAMYCIN PHOSPHATE 900 MG/50ML IV SOLN
INTRAVENOUS | Status: AC
  Filled 2018-03-25: qty 50

## 2018-03-25 SURGICAL SUPPLY — 50 items
BAG COUNTER SPONGE EZ (MISCELLANEOUS) ×2 IMPLANT
BANDAGE ELASTIC 4 LF NS (GAUZE/BANDAGES/DRESSINGS) ×2 IMPLANT
BANDAGE ELASTIC 6 LF NS (GAUZE/BANDAGES/DRESSINGS) ×4 IMPLANT
BLADE MED AGGRESSIVE (BLADE) ×2 IMPLANT
BLADE OSC/SAGITTAL MD 5.5X18 (BLADE) IMPLANT
BLADE OSC/SAGITTAL MD 9X18.5 (BLADE) ×2 IMPLANT
BLADE SURG 15 STRL LF DISP TIS (BLADE) ×2 IMPLANT
BLADE SURG 15 STRL SS (BLADE) ×2
BLADE SURG MINI STRL (BLADE) ×2 IMPLANT
BNDG CONFORM 3 STRL LF (GAUZE/BANDAGES/DRESSINGS) ×2 IMPLANT
BNDG ESMARK 4X12 TAN STRL LF (GAUZE/BANDAGES/DRESSINGS) ×2 IMPLANT
BNDG GAUZE 4.5X4.1 6PLY STRL (MISCELLANEOUS) ×2 IMPLANT
CANISTER SUCT 1200ML W/VALVE (MISCELLANEOUS) ×2 IMPLANT
COVER WAND RF STERILE (DRAPES) IMPLANT
DRAPE FLUOR MINI C-ARM 54X84 (DRAPES) ×2 IMPLANT
DURAPREP 26ML APPLICATOR (WOUND CARE) ×2 IMPLANT
Dynaclip 2-leg Procedure Pack ×2 IMPLANT
ELECT REM PT RETURN 9FT ADLT (ELECTROSURGICAL) ×2
ELECTRODE REM PT RTRN 9FT ADLT (ELECTROSURGICAL) ×1 IMPLANT
GAUZE 4X4 16PLY RFD (DISPOSABLE) ×2 IMPLANT
GAUZE PETRO XEROFOAM 1X8 (MISCELLANEOUS) ×2 IMPLANT
GAUZE SPONGE 4X4 12PLY STRL (GAUZE/BANDAGES/DRESSINGS) ×2 IMPLANT
GLOVE BIO SURGEON STRL SZ7.5 (GLOVE) ×2 IMPLANT
GLOVE INDICATOR 8.0 STRL GRN (GLOVE) ×2 IMPLANT
GOWN STRL REUS W/ TWL LRG LVL3 (GOWN DISPOSABLE) ×2 IMPLANT
GOWN STRL REUS W/TWL LRG LVL3 (GOWN DISPOSABLE) ×2
KIT PROCEDURE DRILL (DRILL) ×2 IMPLANT
NEEDLE FILTER BLUNT 18X 1/2SAF (NEEDLE) ×1
NEEDLE FILTER BLUNT 18X1 1/2 (NEEDLE) ×1 IMPLANT
NS IRRIG 500ML POUR BTL (IV SOLUTION) ×2 IMPLANT
PACK EXTREMITY ARMC (MISCELLANEOUS) ×2 IMPLANT
PAD CAST CTTN 4X4 STRL (SOFTGOODS) ×1 IMPLANT
PAD PREP 24X41 OB/GYN DISP (PERSONAL CARE ITEMS) ×2 IMPLANT
PADDING CAST COTTON 4X4 STRL (SOFTGOODS) ×1
PENCIL ELECTRO HAND CTR (MISCELLANEOUS) ×2 IMPLANT
PIN BALLS 3/8 F/.054-.062 WIRE (MISCELLANEOUS) ×2 IMPLANT
RASP SM TEAR CROSS CUT (RASP) ×2 IMPLANT
STAPLE DYNACLIP 8X8 (Staple) ×2 IMPLANT
STOCKINETTE STRL 6IN 960660 (GAUZE/BANDAGES/DRESSINGS) ×2 IMPLANT
STRAP SAFETY 5IN WIDE (MISCELLANEOUS) IMPLANT
STRIP CLOSURE SKIN 1/4X4 (GAUZE/BANDAGES/DRESSINGS) ×2 IMPLANT
SUT ETHILON 5-0 FS-2 18 BLK (SUTURE) IMPLANT
SUT VIC AB 3-0 SH 27 (SUTURE) ×2
SUT VIC AB 3-0 SH 27X BRD (SUTURE) ×2 IMPLANT
SUT VIC AB 4-0 FS2 27 (SUTURE) IMPLANT
SUT VIC AB 4-0 RB1 27 (SUTURE)
SUT VIC AB 4-0 RB1 27X BRD (SUTURE) IMPLANT
SYR 10ML LL (SYRINGE) ×2 IMPLANT
WIRE Z .045 C-WIRE SPADE TIP (WIRE) ×2 IMPLANT
WIRE Z .062 C-WIRE SPADE TIP (WIRE) ×2 IMPLANT

## 2018-03-25 NOTE — Op Note (Signed)
Operative note   Surgeon:Sabriya Yono Lawyer: None    Preop diagnosis: Left foot hallux valgus deformity    Postop diagnosis: Same    Procedure: [place] with Akin double osteotomy hallux valgus correction    EBL: Minimal    Anesthesia:local and general.  Local consisted of a one-to-one mixture of 0.5% bupivacaine and Exparel long-acting anesthetic.  A total of 20 cc was used preoperatively.  An additional 8 cc of Exparel was used at the end of the case.    Hemostasis: Mid calf tourniquet inflated to 200 mmHg for approximately 60 minutes    Specimen: None    Complications: None    Operative indications:Christina Cuevas is an 78 y.o. that presents today for surgical intervention.  The risks/benefits/alternatives/complications have been discussed and consent has been given.    Procedure:  Patient was brought into the OR and placed on the operating table in thesupine position. After anesthesia was obtained theleft lower extremity was prepped and draped in usual sterile fashion.  Attention was directed to the dorsomedial left first MTPJ where an incision was performed.  Sharp and blunt dissection carried down to the capsule.  A T capsulotomy was then created.  The dorsomedial eminence was noted and transected with a power saw.  A V osteotomy was created.  The capital fragment was translocated laterally.  Residual valgus and tension along the intermetatarsal space was noted.  The intermetatarsal space was then entered and the DTI L was transected.  The conjoined tendon of the abductor was removed off the base of the proximal phalanx.  Better alignment of the first MTPJ was noted.  The capital fragment was translocated laterally and stabilized with a 0.062 K wire.  The ensuing overhanging ledge was then transected with a power saw and smoothed with a power rasp.  Residual valgus deformity of the great toe was noted.  At this time a an Akin proximal phalanx osteotomy was created.  The apex was  laterally based and the small wedge was removed.  This was stabilized with a small compression bone staple.  Good realignment was noted.  The wound was flushed with copious amounts of irrigation.  The capsule was reapproximated with a 3-0 Vicryl.  The subcutaneous tissue reapproximated with a 4-0 Vicryl and the skin reapproximated with 5-0 Monocryl.  A bulky sterile dressing was then applied to the left foot.    Patient tolerated the procedure and anesthesia well.  Was transported from the OR to the PACU with all vital signs stable and vascular status intact. To be discharged per routine protocol.  Will follow up in approximately 1 week in the outpatient clinic.

## 2018-03-25 NOTE — Transfer of Care (Signed)
Immediate Anesthesia Transfer of Care Note  Patient: Christina Cuevas  Procedure(s) Performed: DOUBLE OSTEOTOMY (Left Foot)  Patient Location: PACU  Anesthesia Type:General  Level of Consciousness: awake  Airway & Oxygen Therapy: Patient Spontanous Breathing  Post-op Assessment: Report given to RN  Post vital signs: stable  Last Vitals:  Vitals Value Taken Time  BP 122/67 03/25/2018  9:10 AM  Temp    Pulse 101 03/25/2018  9:11 AM  Resp 12 03/25/2018  9:11 AM  SpO2 97 % 03/25/2018  9:11 AM  Vitals shown include unvalidated device data.  Last Pain:  Vitals:   03/25/18 0635  TempSrc: Oral  PainSc: 2          Complications: No apparent anesthesia complications

## 2018-03-25 NOTE — Anesthesia Preprocedure Evaluation (Signed)
Anesthesia Evaluation  Patient identified by MRN, date of birth, ID band Patient awake    Reviewed: Allergy & Precautions, NPO status , Patient's Chart, lab work & pertinent test results  History of Anesthesia Complications Negative for: history of anesthetic complications  Airway Mallampati: II       Dental   Pulmonary neg sleep apnea, neg COPD,           Cardiovascular hypertension, Pt. on medications (-) Past MI and (-) CHF (-) dysrhythmias (-) Valvular Problems/Murmurs     Neuro/Psych neg Seizures    GI/Hepatic Neg liver ROS, GERD  Medicated and Controlled,  Endo/Other  neg diabetes (diet controlled)Hypothyroidism   Renal/GU Renal InsufficiencyRenal disease     Musculoskeletal   Abdominal   Peds  Hematology   Anesthesia Other Findings   Reproductive/Obstetrics                             Anesthesia Physical Anesthesia Plan  ASA: II  Anesthesia Plan: General   Post-op Pain Management:    Induction:   PONV Risk Score and Plan: 3 and Dexamethasone, Ondansetron and Midazolam  Airway Management Planned: LMA  Additional Equipment:   Intra-op Plan:   Post-operative Plan:   Informed Consent: I have reviewed the patients History and Physical, chart, labs and discussed the procedure including the risks, benefits and alternatives for the proposed anesthesia with the patient or authorized representative who has indicated his/her understanding and acceptance.       Plan Discussed with:   Anesthesia Plan Comments:         Anesthesia Quick Evaluation

## 2018-03-25 NOTE — Discharge Instructions (Signed)
Bunion Surgery, Care After This sheet gives you information about how to care for yourself after your procedure. Your health care provider may also give you more specific instructions. If you have problems or questions, contact your health care provider. What can I expect after the procedure? After the procedure, it is common to have:  Pain.  Swelling.  A small amount of fluid coming from your incision. Follow these instructions at home: If you have a post-operative brace, boot, or shoe:  Wear the post-op (post-operative) brace, boot, or shoe as told by your health care provider. Remove it only as told by your health care provider.  Loosen the brace, boot, or shoe if your toes tingle, become numb, or turn cold and blue.  Keep the brace, boot, or shoe clean and dry. If you have a cast:  Do not stick anything inside the cast to scratch your skin. Doing that increases your risk of infection.  Check the skin around the cast every day. Tell your health care provider about any concerns.  You may put lotion on dry skin around the edges of the cast. Do not put lotion on the skin underneath the cast.  Keep the cast clean and dry. Bathing  Do not take baths, swim, or use a hot tub until your health care provider approves. Ask your health care provider if you may take showers.  If your brace, boot, shoe, or cast is not waterproof: ? Do not let it get wet. ? Cover it with a watertight covering when you take a bath or a shower.  Keep your bandage (dressing) dry until your health care provider says it can be removed. Incision care   The dressing holds your toe in the correct position. Do not change the dressing until your health care provider approves.  Follow instructions from your health care provider about how to take care of your incision. Make sure you: ? Wash your hands with soap and water before you change your dressing. If soap and water are not available, use hand  sanitizer. ? Change your dressing as told by your health care provider. ? Leave stitches (sutures), skin glue, or adhesive strips in place. These skin closures may need to stay in place for 2 weeks or longer. If adhesive strip edges start to loosen and curl up, you may trim the loose edges. Do not remove adhesive strips completely unless your health care provider tells you to do that.  Check your incision area every day for signs of infection. Check for: ? More redness, swelling, or pain. ? Blood or more fluid. ? Warmth. ? Pus or a bad smell. Managing pain, stiffness, and swelling   If directed, put ice on the affected area. ? If you have a removable brace, boot, or shoe, remove it as told by your health care provider. ? Put ice in a plastic bag. ? Place a towel between your skin and the bag or between your cast and the bag. ? Leave the ice on for 20 minutes, 2-3 times a day.  Move your toes often to avoid stiffness and to lessen swelling.  Raise (elevate) your foot above the level of your heart while you are sitting or lying down. Driving  Do not drive for 24 hours if you were given a medicine to help you relax (sedative) during your procedure.  Do not drive or use heavy machinery while taking prescription pain medicine.  Ask your health care provider when it is safe to drive  if you have a brace, boot, shoe, or cast on your foot. Activity  Return to your normal activities as told by your health care provider. Ask your health care provider what activities are safe for you.  If physical therapy was prescribed, do exercises as told by your health care provider. Safety  Do not use the affected leg to support (bear) your body weight until your health care provider says that you can. Follow weight-bearing restrictions as told. Use crutches, a cane, or a walker as told by your health care provider. General instructions  Do not use any products that contain nicotine or tobacco, such as  cigarettes and e-cigarettes. These can delay bone healing. If you need help quitting, ask your health care provider.  Take over-the-counter and prescription medicines only as told by your health care provider.  If you are taking prescription pain medicine, take actions to prevent or treat constipation. Your health care provider may recommend that you: ? Drink enough fluid to keep your urine pale yellow. ? Eat foods that are high in fiber, such as fresh fruits and vegetables, whole grains, and beans. ? Limit foods that are high in fat and processed sugars, such as fried or sweet foods. ? Take an over-the-counter or prescription medicine for constipation.  Do not wear high heels or tight-fitting shoes, even after you heal.  Keep all follow-up visits as told by your health care provider. This is important. Contact a health care provider if:  You have more redness, swelling, or pain around your incision.  You have more fluid or blood coming from the incision.  Your incision feels warm to the touch.  There is pus or a bad smell coming from your incision.  You have a fever or chills.  Your dressing gets wet or it falls off.  You have swelling in your lower leg.  You have numbness or stiffness in your toes. Get help right away if:  You have a rash.  You have difficulty breathing. Summary  Do not use the affected leg to support (bear) your body weight until your health care provider says that you can. Follow weight-bearing restrictions as told. Use crutches, a cane, or a walker as told by your health care provider.  If directed, put ice on the affected area. Leave the ice on for 20 minutes, 2-3 times a day.  Do not drive or use heavy machinery while taking prescription pain medicine. This information is not intended to replace advice given to you by your health care provider. Make sure you discuss any questions you have with your health care provider. Document Released: 07/18/2004  Document Revised: 10/05/2016 Document Reviewed: 10/05/2016 Elsevier Interactive Patient Education  2019 [place]   1) The drugs that you were given will stay in your system until tomorrow so for the next 24 hours you should not:  A) Drive an automobile B) Make any legal decisions C) Drink any alcoholic beverage   2) You may resume regular meals tomorrow.  Today it is better to start with liquids and gradually work up to solid foods.  You may eat anything you prefer, but it is better to start with liquids, then soup and crackers, and gradually work up to solid foods.   3) Please notify your doctor immediately if you have any unusual bleeding, trouble breathing, redness and pain at the surgery site, drainage, fever, or pain not relieved by medication.    4) Additional Instructions:  Additional Instructions: ° ° ° ° ° ° ° °Please contact your physician with any problems or Same Day Surgery at [phone], Monday through Friday 6 am to 4 pm, or [facility] Main number at [phone]. ° ° ° ° °

## 2018-03-25 NOTE — Anesthesia Post-op Follow-up Note (Signed)
Anesthesia QCDR form completed.        

## 2018-03-25 NOTE — H&P (Signed)
HISTORY AND PHYSICAL INTERVAL NOTE:  03/25/2018  7:28 AM  Christina Cuevas  has presented today for surgery, with the diagnosis of M20.12  [place].  The various methods of treatment have been discussed with the patient.  No guarantees were given.  After consideration of risks, benefits and other options for treatment, the patient has consented to surgery.  I have reviewed the patients' chart and labs.     A history and physical examination was performed in my office.  The patient was reexamined.  There have been no changes to this history and physical examination.  Samara Deist A

## 2018-03-25 NOTE — Anesthesia Postprocedure Evaluation (Signed)
Anesthesia Post Note  Patient: Christina Cuevas  Procedure(s) Performed: DOUBLE OSTEOTOMY (Left Foot)  Patient location during evaluation: PACU Anesthesia Type: General Level of consciousness: awake and alert Pain management: pain level controlled Vital Signs Assessment: post-procedure vital signs reviewed and stable Respiratory status: spontaneous breathing and respiratory function stable Cardiovascular status: stable Anesthetic complications: no     Last Vitals:  Vitals:   03/25/18 0930 03/25/18 0946  BP:  (!) 148/83  Pulse:  87  Resp:  16  Temp: (!) 36.4 C 36.6 C  SpO2:  100%    Last Pain:  Vitals:   03/25/18 0946  TempSrc: Oral  PainSc: 0-No pain                 KEPHART,WILLIAM K

## 2018-03-29 ENCOUNTER — Encounter: Payer: Self-pay | Admitting: Podiatry

## 2018-05-02 ENCOUNTER — Ambulatory Visit: Payer: Medicare HMO | Admitting: Obstetrics & Gynecology

## 2018-06-02 ENCOUNTER — Encounter: Payer: Self-pay | Admitting: Obstetrics & Gynecology

## 2018-06-02 ENCOUNTER — Ambulatory Visit (INDEPENDENT_AMBULATORY_CARE_PROVIDER_SITE_OTHER): Payer: Medicare HMO | Admitting: Obstetrics & Gynecology

## 2018-06-02 ENCOUNTER — Other Ambulatory Visit: Payer: Self-pay

## 2018-06-02 VITALS — BP 110/60 | Ht 62.0 in | Wt 155.0 lb

## 2018-06-02 DIAGNOSIS — R32 Unspecified urinary incontinence: Secondary | ICD-10-CM

## 2018-06-02 DIAGNOSIS — N814 Uterovaginal prolapse, unspecified: Secondary | ICD-10-CM

## 2018-06-02 NOTE — Progress Notes (Signed)
  HPI:      [name] is a 78 y.o. [phone] who presents today for her pessary follow up and examination related to her pelvic floor weakening.  Pt reports tolerating the pessary well with  no vaginal bleeding and  no vaginal discharge.  Symptoms of pelvic floor weakening have greatly improved. She is voiding and defecating without difficulty although she has daily leakage of urine, wears a pad. She currently has a #6 Ring pessary.  PMHx: She  has a past medical history of Breast cancer ([place]) (2011), Chronic kidney disease, Colitis, Diverticulitis, Hyperchloremia, Hypertension, Hypothyroidism, and Pre-diabetes. Also,  has a past surgical history that includes Colonoscopy; Breast lumpectomy (Left, 02/2009); Breast surgery (Right, 02/2009); Appendectomy (1950); and Bunionectomy (Left, 03/25/2018)., family history includes Breast cancer in her cousin; Cancer in her paternal uncle; Migraines in her maternal grandmother; Pancreatic cancer in her father; Parkinson's disease in her mother.,  reports that she has never smoked. She has never used smokeless tobacco. She reports that she does not drink alcohol or use drugs.  She has a current medication list which includes the following prescription(s): anastrozole, aspirin ec, cetirizine, cholecalciferol, cinnamon, duloxetine, levothyroxine, lisinopril, simethicone, loperamide, pantoprazole, simvastatin, and trimethoprim. Also, is allergic to cephalexin; ciprofloxacin; and metronidazole.  Review of Systems  All other systems reviewed and are negative.   Objective: BP 110/60   Ht 5\' 2"  (1.575 m)   Wt 155 lb (70.3 kg)   BMI 28.35 kg/m  Physical Exam Constitutional:      General: She is not in acute distress.    Appearance: She is well-developed.  Genitourinary:     Pelvic exam was performed with patient supine.     Vagina and uterus normal.     No vaginal erythema or bleeding.     No cervical motion tenderness, discharge, polyp or nabothian cyst.      Uterus is mobile.     Uterus is not enlarged.     No uterine mass detected.    Uterus is midaxial.     No right or left adnexal mass present.     Right adnexa not tender.     Left adnexa not tender.     Genitourinary Comments: Gr3 POP and cystocele Gr1 rectocele  HENT:     Head: Normocephalic and atraumatic.     Nose: Nose normal.  Abdominal:     General: There is no distension.     Palpations: Abdomen is soft.     Tenderness: There is no abdominal tenderness.  Musculoskeletal: Normal range of motion.  Neurological:     Mental Status: She is alert and oriented to person, place, and time.     Cranial Nerves: No cranial nerve deficit.  Skin:    General: Skin is warm and dry.     Pessary Care Pessary removed and cleaned.  Vagina checked - without erosions - pessary replaced.  A/P:1. Cystocele with uterine descensus 2. Incontinence of urine in female Pessary was cleaned and replaced today. Instructions given for care. Concerning symptoms to observe for are counseled to patient. Follow up scheduled for 3 months. Options for surgery (TVH, BSO, AP Repair, TVT sling) discussed as alternative option as well as to improve incontinence.  A total of 15 minutes were spent face-to-face with the patient during this encounter and over half of that time dealt with counseling and coordination of care.  Barnett Applebaum, MD, [facility], [facility] 06/02/2018  1:48 PM

## 2018-06-26 ENCOUNTER — Emergency Department
Admission: EM | Admit: 2018-06-26 | Discharge: 2018-06-27 | Disposition: A | Discharge status: Home / Self Care | Payer: Medicare HMO | Attending: Emergency Medicine | Admitting: Emergency Medicine

## 2018-06-26 ENCOUNTER — Other Ambulatory Visit: Payer: Self-pay

## 2018-06-26 DIAGNOSIS — I129 Hypertensive chronic kidney disease with stage 1 through stage 4 chronic kidney disease, or unspecified chronic kidney disease: Secondary | ICD-10-CM | POA: Insufficient documentation

## 2018-06-26 DIAGNOSIS — N182 Chronic kidney disease, stage 2 (mild): Secondary | ICD-10-CM | POA: Diagnosis not present

## 2018-06-26 DIAGNOSIS — Z853 Personal history of malignant neoplasm of breast: Secondary | ICD-10-CM | POA: Diagnosis not present

## 2018-06-26 DIAGNOSIS — E039 Hypothyroidism, unspecified: Secondary | ICD-10-CM | POA: Insufficient documentation

## 2018-06-26 DIAGNOSIS — Z20828 Contact with and (suspected) exposure to other viral communicable diseases: Secondary | ICD-10-CM | POA: Insufficient documentation

## 2018-06-26 DIAGNOSIS — R1030 Lower abdominal pain, unspecified: Secondary | ICD-10-CM | POA: Insufficient documentation

## 2018-06-26 DIAGNOSIS — Z79899 Other long term (current) drug therapy: Secondary | ICD-10-CM | POA: Diagnosis not present

## 2018-06-26 DIAGNOSIS — Z7982 Long term (current) use of aspirin: Secondary | ICD-10-CM | POA: Diagnosis not present

## 2018-06-26 LAB — COMPREHENSIVE METABOLIC PANEL
ALT: 28 U/L (ref 0–44)
AST: 24 U/L (ref 15–41)
Albumin: 4.9 g/dL (ref 3.5–5.0)
Alkaline Phosphatase: 86 U/L (ref 38–126)
Anion gap: 14 (ref 5–15)
BUN: 23 mg/dL (ref 8–23)
CO2: 21 mmol/L — ABNORMAL LOW (ref 22–32)
Calcium: 9.7 mg/dL (ref 8.9–10.3)
Chloride: 102 mmol/L (ref 98–111)
Creatinine, Ser: 0.96 mg/dL (ref 0.44–1.00)
GFR calc Af Amer: >60 mL/min (ref >60)
GFR calc non Af Amer: 57 mL/min — ABNORMAL LOW (ref >60)
Glucose, Bld: 110 mg/dL — ABNORMAL HIGH (ref 70–99)
Potassium: 3.9 mmol/L (ref 3.5–5.1)
Sodium: 137 mmol/L (ref 135–145)
Total Bilirubin: 1 mg/dL (ref 0.3–1.2)
Total Protein: 8 g/dL (ref 6.5–8.1)

## 2018-06-26 LAB — CBC
HCT: 44.1 % (ref 36.0–46.0)
Hemoglobin: 14.9 g/dL (ref 12.0–15.0)
MCH: 30.4 pg (ref 26.0–34.0)
MCHC: 33.8 g/dL (ref 30.0–36.0)
MCV: 90 fL (ref 80.0–100.0)
Platelets: 227 10*3/uL (ref 150–400)
RBC: 4.9 MIL/uL (ref 3.87–5.11)
RDW: 12.7 % (ref 11.5–15.5)
WBC: 8.8 10*3/uL (ref 4.0–10.5)
nRBC: 0 % (ref 0.0–0.2)

## 2018-06-26 LAB — LIPASE, BLOOD: Lipase: 28 U/L (ref 11–51)

## 2018-06-26 LAB — SARS CORONAVIRUS 2 BY RT PCR (HOSPITAL ORDER, PERFORMED IN ~~LOC~~ HOSPITAL LAB): SARS Coronavirus 2: NEGATIVE

## 2018-06-26 LAB — TROPONIN I: Troponin I: <0.03 ng/mL (ref <0.03)

## 2018-06-26 MED ORDER — ONDANSETRON HCL 4 MG/2ML IJ SOLN
4.0000 mg | Freq: Once | INTRAMUSCULAR | Status: AC
  Administered 2018-06-26: 4 mg via INTRAVENOUS
  Filled 2018-06-26: qty 2

## 2018-06-26 MED ORDER — SODIUM CHLORIDE 0.9 % IV BOLUS
1000.0000 mL | Freq: Once | INTRAVENOUS | Status: AC
  Administered 2018-06-26: 23:00:00 1000 mL via INTRAVENOUS

## 2018-06-26 NOTE — ED Notes (Signed)
Pt ambu to toilet, reports nausea "better"

## 2018-06-26 NOTE — ED Triage Notes (Signed)
Patient has had nausea since Friday. Patient has been having cramps in her abdomen for about 30 minutes. Patient is a prediabetic and has a history of MS. Patient had breast cancer in her left side. Patient had diverticulitis once.

## 2018-06-26 NOTE — ED Provider Notes (Signed)
[facility] Emergency Department Provider Note  Time seen: 10:16 PM  I have reviewed the triage vital signs and the nursing notes.   HISTORY  Chief Complaint Abdominal Pain, Nausea, and Dizziness   HPI Christina Cuevas is a 78 y.o. female with a past medical history of CKD, hypertension, presents to the emergency department for nausea.  According to the patient she has breast cancer currently being treated, states she has been experiencing nausea over the past 3 days.  She states starting Friday she began feeling nauseated somewhat weak.  States it has been coming and going ever since.  States yesterday she developed some lower abdominal discomfort but resolved on its own.  Denies any dysuria denies any actual vomiting or diarrhea.  Denies any fever cough congestion or shortness of breath.  Patient has an appointment with her doctor on Tuesday, but states the nausea worsened today around 8 PM so she came to the emergency department for evaluation.  Past Medical History:  Diagnosis Date  . Breast cancer ([place]) 2011   left breast cancer treated with radiation  . 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 Colitis   . Diverticulitis   . Hyperchloremia   . Hypertension   . Hypothyroidism    on synthroid  . Pre-diabetes    diet controlled    Patient Active Problem List   Diagnosis Date Noted  . Incontinence of urine in female 07/28/2017  . 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 APPENDECTOMY  1950  . BREAST LUMPECTOMY Left 02/2009   lymph nodes removed  . BREAST SURGERY Right 02/2009   sclerosing lesion removed at same time  . BUNIONECTOMY Left 03/25/2018   Procedure: DOUBLE OSTEOTOMY;  Surgeon: Samara Deist, DPM;  Location: [facility] ORS;  Service: Podiatry;  Laterality: Left;  . COLONOSCOPY      Prior to Admission medications   Medication Sig Start Date End Date Taking? Authorizing  Provider  anastrozole (ARIMIDEX) 1 MG tablet Take 1 tablet by mouth daily. 03/05/14   [provider]  aspirin EC 81 MG tablet Take 1 tablet by mouth daily.    [provider]  cetirizine (ZYRTEC) 10 MG tablet Take 10 mg by mouth daily.     [provider]  Cholecalciferol (VITAMIN D-1000 MAX ST) 1000 UNITS tablet Take 4,000 Units by mouth 2 (two) times daily. 2000 mg in morning and again at night    [provider]  Cinnamon 500 MG capsule Take 1,000 mg by mouth 2 (two) times daily.     [provider]  DULoxetine (CYMBALTA) 30 MG capsule Take 30 mg by mouth daily.  06/18/17   [provider]  levothyroxine (SYNTHROID, LEVOTHROID) 88 MCG tablet Take 88 mcg by mouth daily before breakfast.  07/20/14   [provider]  lisinopril (PRINIVIL,ZESTRIL) 10 MG tablet Take 5 mg by mouth daily.  09/24/14   [provider]  loperamide (IMODIUM A-D) 2 MG tablet Take 1 tablet (2 mg total) by mouth 4 (four) times daily as needed. Patient not taking: Reported on 06/02/2018 12/28/14   Eula Listen, MD  pantoprazole (PROTONIX) 40 MG tablet Take 40 mg by mouth daily.  09/24/14   [provider]  simethicone (MYLICON) 267 MG chewable tablet Chew 250 mg by mouth every morning.     [provider]  simvastatin (ZOCOR) 10 MG tablet Take 5 mg by mouth  every evening.  11/01/14   [provider]  trimethoprim (TRIMPEX) 100 MG tablet Take 100 mg by mouth at bedtime. 02/21/18   [provider]    Allergies  Allergen Reactions  . Cephalexin Nausea Only  . Ciprofloxacin Nausea Only  . Metronidazole Nausea Only    Family History  Problem Relation Age of Onset  . Parkinson's disease Mother   . Pancreatic cancer Father   . Cancer Paternal Uncle        2 uncles   . Migraines Maternal Grandmother   . Breast cancer Cousin     Social History Social History   Tobacco Use  . Smoking status: Never Smoker  .  Smokeless tobacco: Never Used  Substance Use Topics  . Alcohol use: No  . Drug use: No    Review of Systems Constitutional: Negative for fever.  Positive for weakness. Cardiovascular: Negative for chest pain. Respiratory: Negative for shortness of breath. Gastrointestinal: Negative for abdominal pain.  Positive for nausea.  Negative for vomiting or diarrhea. Genitourinary: Negative for urinary compaints Musculoskeletal: Negative for musculoskeletal complaints Skin: Negative for skin complaints  Neurological: Negative for headache All other ROS negative  ____________________________________________   PHYSICAL EXAM:  VITAL SIGNS: ED Triage Vitals  Enc Vitals Group     BP 06/26/18 2143 (!) 171/83     Pulse Rate 06/26/18 2143 62     Resp 06/26/18 2143 14     Temp 06/26/18 2143 98.4 F (36.9 C)     Temp Source 06/26/18 2143 Oral     SpO2 06/26/18 2143 97 %     Weight 06/26/18 2147 141 lb (64 kg)     Height 06/26/18 2147 5\' 1"  (1.549 m)     Head Circumference --      Peak Flow --      Pain Score 06/26/18 2146 1     Pain Loc --      Pain Edu? --      Excl. in [place]? --    Constitutional: Alert and oriented. Well appearing and in no distress. Eyes: Normal exam ENT      Head: Normocephalic and atraumatic.      Mouth/Throat: Mucous membranes are moist. Cardiovascular: Normal rate, regular rhythm. No murmur Respiratory: Normal respiratory effort without tachypnea nor retractions. Breath sounds are clear  Gastrointestinal: Soft and nontender. No distention.  Benign abdominal exam. Musculoskeletal: Nontender with normal range of motion in all extremities. No lower extremity tenderness or edema. Neurologic:  Normal speech and language. No gross focal neurologic deficits  Skin:  Skin is warm, dry and intact.  Psychiatric: Mood and affect are normal.   ____________________________________________    EKG  EKG viewed and interpreted by myself shows a normal sinus rhythm at 60  bpm with a narrow QRS, normal axis, normal intervals, no concerning ST changes.  ____________________________________________    INITIAL IMPRESSION / ASSESSMENT AND PLAN / ED COURSE  Pertinent labs & imaging results that were available during my care of the patient were reviewed by me and considered in my medical decision making (see chart for details).   Patient presents to the emergency department for generalized weakness and nausea over the past 3 days.  Overall the patient appears well, reassuring physical exam, reassuring vitals besides hypertension.  Patient has a benign abdominal exam.  However given the patient's nausea we will check labs, including cardiac enzymes, IV hydrate, treat nausea with Zofran.  We will obtain a corona swab as a precaution.  Patient agreeable to plan of care.  Differential this time would include viral syndrome such as coronavirus, gastroenteritis, gastritis, reflux, ACS, UTI.  Patient's labs are largely within normal limits.  Coronavirus and urinalysis are pending.  Patient care signed out to oncoming physician.  Sherica Paternostro was evaluated in Emergency Department on 06/26/2018 for the symptoms described in the history of present illness. She was evaluated in the context of the global COVID-19 pandemic, which necessitated consideration that the patient might be at risk for infection with the SARS-CoV-2 virus that causes COVID-19. Institutional protocols and algorithms that pertain to the evaluation of patients at risk for COVID-19 are in a state of rapid change based on information released by regulatory bodies including the CDC and federal and state organizations. These policies and algorithms were followed during the patient's care in the ED.  ____________________________________________   FINAL CLINICAL IMPRESSION(S) / ED DIAGNOSES  Nausea Weakness   Harvest Dark, MD 06/26/18 2338

## 2018-06-27 LAB — URINALYSIS, COMPLETE (UACMP) WITH MICROSCOPIC
Bacteria, UA: NONE SEEN
Bilirubin Urine: NEGATIVE
Glucose, UA: NEGATIVE mg/dL
Ketones, ur: 20 mg/dL — AB
Nitrite: NEGATIVE
Protein, ur: NEGATIVE mg/dL
Specific Gravity, Urine: 1.013 (ref 1.005–1.030)
pH: 6 (ref 5.0–8.0)

## 2018-06-27 MED ORDER — AMOXICILLIN-POT CLAVULANATE 875-125 MG PO TABS: 1.0000 | ORAL_TABLET | Freq: Two times a day (BID) | ORAL | 0 refills | Status: AC

## 2018-06-27 NOTE — ED Notes (Signed)
Peripheral IV discontinued. Catheter intact. No signs of infiltration or redness. Gauze applied to IV site.   Discharge instructions reviewed with patient. Questions fielded by this RN. Patient verbalizes understanding of instructions. Patient discharged home in stable condition per brown. No acute distress noted at time of discharge.    

## 2018-06-28 ENCOUNTER — Other Ambulatory Visit: Payer: Self-pay | Admitting: Family Medicine

## 2018-06-28 DIAGNOSIS — R3121 Asymptomatic microscopic hematuria: Secondary | ICD-10-CM

## 2018-06-28 DIAGNOSIS — R110 Nausea: Secondary | ICD-10-CM

## 2018-06-28 DIAGNOSIS — R1084 Generalized abdominal pain: Secondary | ICD-10-CM

## 2018-07-08 ENCOUNTER — Ambulatory Visit
Admission: RE | Admit: 2018-07-08 | Discharge: 2018-07-08 | Disposition: A | Discharge status: Home / Self Care | Payer: Medicare HMO | Source: Ambulatory Visit | Attending: Family Medicine | Admitting: Family Medicine

## 2018-07-08 ENCOUNTER — Other Ambulatory Visit: Payer: Self-pay

## 2018-07-08 DIAGNOSIS — R110 Nausea: Secondary | ICD-10-CM | POA: Insufficient documentation

## 2018-07-08 DIAGNOSIS — R1084 Generalized abdominal pain: Secondary | ICD-10-CM | POA: Diagnosis present

## 2018-07-08 MED ORDER — IOHEXOL 300 MG/ML  SOLN
100.0000 mL | Freq: Once | INTRAMUSCULAR | Status: AC | PRN
  Administered 2018-07-08: 100 mL via INTRAVENOUS

## 2018-09-02 ENCOUNTER — Encounter: Payer: Self-pay | Admitting: Obstetrics & Gynecology

## 2018-09-02 ENCOUNTER — Ambulatory Visit: Payer: Medicare HMO | Admitting: Obstetrics & Gynecology

## 2018-09-02 ENCOUNTER — Other Ambulatory Visit: Payer: Self-pay

## 2018-09-02 VITALS — BP 110/60 | Ht 62.0 in | Wt 153.0 lb

## 2018-09-02 DIAGNOSIS — N814 Uterovaginal prolapse, unspecified: Secondary | ICD-10-CM

## 2018-09-02 NOTE — Progress Notes (Signed)
  HPI:      [name] is a 78 y.o. [phone] who presents today for her pessary follow up and examination related to her pelvic floor weakening.  Pt reports tolerating the pessary well with  no vaginal bleeding and  no vaginal discharge.  Symptoms of pelvic floor weakening have greatly improved. She is voiding and defecating without difficulty. She currently has a #6 Ring w support pessary.  PMHx: She  has a past medical history of Breast cancer ([place]) (2011), Chronic kidney disease, Colitis, Diverticulitis, Hyperchloremia, Hypertension, Hypothyroidism, and Pre-diabetes. Also,  has a past surgical history that includes Colonoscopy; Breast lumpectomy (Left, 02/2009); Breast surgery (Right, 02/2009); Appendectomy (1950); and Bunionectomy (Left, 03/25/2018)., family history includes Breast cancer in her cousin; Cancer in her paternal uncle; Migraines in her maternal grandmother; Pancreatic cancer in her father; Parkinson's disease in her mother.,  reports that she has never smoked. She has never used smokeless tobacco. She reports that she does not drink alcohol or use drugs.  She has a current medication list which includes the following prescription(s): anastrozole, aspirin ec, cetirizine, cholecalciferol, cinnamon, duloxetine, levothyroxine, lisinopril, loperamide, pantoprazole, simethicone, simvastatin, and trimethoprim. Also, is allergic to cephalexin; ciprofloxacin; and metronidazole.  Review of Systems  All other systems reviewed and are negative.   Objective: BP 110/60   Ht 5\' 2"  (1.575 m)   Wt 153 lb (69.4 kg)   BMI 27.98 kg/m  Physical Exam Constitutional:      General: She is not in acute distress.    Appearance: She is well-developed.  Genitourinary:     Pelvic exam was performed with patient supine.     Vagina and uterus normal.     No vaginal erythema or bleeding.     No cervical motion tenderness, discharge, polyp or nabothian cyst.     Uterus is mobile.     Uterus is not  enlarged.     No uterine mass detected.    Uterus is midaxial.     No right or left adnexal mass present.     Right adnexa not tender.     Left adnexa not tender.     Genitourinary Comments: Gr3 POP and cystocele Gr1 rectocele   HENT:     Head: Normocephalic and atraumatic.     Nose: Nose normal.  Abdominal:     General: There is no distension.     Palpations: Abdomen is soft.     Tenderness: There is no abdominal tenderness.  Musculoskeletal: Normal range of motion.  Neurological:     Mental Status: She is alert and oriented to person, place, and time.     Cranial Nerves: No cranial nerve deficit.  Skin:    General: Skin is warm and dry.     Pessary Care Pessary removed and cleaned.  Vagina checked - without erosions - pessary replaced.  A/P:   ICD-10-CM   1. Cystocele with uterine descensus  N81.4   Pessary was cleaned and replaced today. Instructions given for care. Concerning symptoms to observe for are counseled to patient. Follow up scheduled for 3 months.  A total of 15 minutes were spent face-to-face with the patient during this encounter and over half of that time dealt with counseling and coordination of care.  Barnett Applebaum, MD, [facility], [facility] 09/02/2018  1:42 PM

## 2018-10-04 ENCOUNTER — Encounter: Payer: Self-pay | Admitting: Obstetrics & Gynecology

## 2018-10-04 ENCOUNTER — Ambulatory Visit (INDEPENDENT_AMBULATORY_CARE_PROVIDER_SITE_OTHER): Payer: Medicare HMO | Admitting: Obstetrics & Gynecology

## 2018-10-04 ENCOUNTER — Other Ambulatory Visit: Payer: Self-pay

## 2018-10-04 VITALS — BP 120/64 | HR 84 | Ht 62.0 in | Wt 155.0 lb

## 2018-10-04 DIAGNOSIS — N939 Abnormal uterine and vaginal bleeding, unspecified: Secondary | ICD-10-CM

## 2018-10-04 NOTE — Progress Notes (Signed)
HPI:      [name] is a 78 y.o. [phone] who presents today for some light vaginal bleeding first noted today, she has had pessary for years (Ring #6) and this is first occurrence; and no vaginal discharge.  Symptoms of pelvic floor weakening have greatly improved w the pessary. She is voiding and defecating without difficulty.  PMHx: She  has a past medical history of Breast cancer ([place]) (2011), Chronic kidney disease, Colitis, Diverticulitis, Hyperchloremia, Hypertension, Hypothyroidism, and Pre-diabetes. Also,  has a past surgical history that includes Colonoscopy; Breast lumpectomy (Left, 02/2009); Breast surgery (Right, 02/2009); Appendectomy (1950); and Bunionectomy (Left, 03/25/2018)., family history includes Breast cancer in her cousin; Cancer in her paternal uncle; Migraines in her maternal grandmother; Pancreatic cancer in her father; Parkinson's disease in her mother.,  reports that she has never smoked. She has never used smokeless tobacco. She reports that she does not drink alcohol or use drugs.  She has a current medication list which includes the following prescription(s): anastrozole, aspirin ec, cetirizine, cholecalciferol, cinnamon, duloxetine, levothyroxine, lisinopril, loperamide, pantoprazole, simethicone, simvastatin, and trimethoprim. Also, is allergic to cephalexin; ciprofloxacin; and metronidazole.  Review of Systems  Constitutional: Negative for chills, fever and malaise/fatigue.  HENT: Negative for congestion, sinus pain and sore throat.   Eyes: Negative for blurred vision and pain.  Respiratory: Negative for cough and wheezing.   Cardiovascular: Negative for chest pain and leg swelling.  Gastrointestinal: Negative for abdominal pain, constipation, diarrhea, heartburn, nausea and vomiting.  Genitourinary: Negative for dysuria, frequency, hematuria and urgency.  Musculoskeletal: Negative for back pain, joint pain, myalgias and neck pain.  Skin: Negative for itching  and rash.  Neurological: Negative for dizziness, tremors and weakness.  Endo/Heme/Allergies: Does not bruise/bleed easily.  Psychiatric/Behavioral: Negative for depression. The patient is not nervous/anxious and does not have insomnia.     Objective: BP 120/64 (BP Location: Right Arm, Patient Position: Sitting, Cuff Size: Normal)   Pulse 84   Ht 5\' 2"  (1.575 m)   Wt 155 lb (70.3 kg)   BMI 28.35 kg/m  Physical Exam Constitutional:      General: She is not in acute distress.    Appearance: She is well-developed.  Genitourinary:     Pelvic exam was performed with patient supine.     Vagina and uterus normal.     No vaginal erythema or bleeding.     Cervical bleeding present.     No cervical motion tenderness, discharge, polyp or nabothian cyst.     Uterus is mobile.     Uterus is not enlarged.     No uterine mass detected.    Uterus is midaxial.     No right or left adnexal mass present.     Right adnexa not tender.     Left adnexa not tender.     Genitourinary Comments: Gr 3 uterine prolpase w cystocele Contact bleeding on cervix  HENT:     Head: Normocephalic and atraumatic.     Nose: Nose normal.  Abdominal:     General: There is no distension.     Palpations: Abdomen is soft.     Tenderness: There is no abdominal tenderness.  Musculoskeletal: Normal range of motion.  Neurological:     Mental Status: She is alert and oriented to person, place, and time.     Cranial Nerves: No cranial nerve deficit.  Skin:    General: Skin is warm and dry.  Psychiatric:        Attention  and Perception: Attention normal.        Mood and Affect: Mood and affect normal.        Speech: Speech normal.        Behavior: Behavior normal.        Thought Content: Thought content normal.        Judgment: Judgment normal.     Pessary Care Pessary removed and cleaned.  Vagina checked, bleeding is on cervix. No vaginal ulcerations.  A/P:1. Abnormal vaginal bleeding Pessary holiday advised.   Will leave out and follow symptoms closely.  Replace in 2 weeks.  A total of 15 minutes were spent face-to-face with the patient during this encounter and over half of that time dealt with counseling and coordination of care.  Barnett Applebaum, MD, [facility], [facility] 10/04/2018  4:55 PM

## 2018-10-17 ENCOUNTER — Ambulatory Visit (INDEPENDENT_AMBULATORY_CARE_PROVIDER_SITE_OTHER): Payer: Medicare HMO | Admitting: Obstetrics & Gynecology

## 2018-10-17 ENCOUNTER — Other Ambulatory Visit: Payer: Self-pay

## 2018-10-17 ENCOUNTER — Encounter: Payer: Self-pay | Admitting: Obstetrics & Gynecology

## 2018-10-17 VITALS — BP 100/60 | Ht 62.0 in | Wt 155.0 lb

## 2018-10-17 DIAGNOSIS — N814 Uterovaginal prolapse, unspecified: Secondary | ICD-10-CM | POA: Diagnosis not present

## 2018-10-17 DIAGNOSIS — R32 Unspecified urinary incontinence: Secondary | ICD-10-CM | POA: Diagnosis not present

## 2018-10-17 NOTE — Progress Notes (Signed)
HPI:      [name] is a 78 y.o. [phone] who presents today for her pessary follow up and examination related to her pelvic floor weakening.  Pt reports she has pressure feelings w pessary out these past 2 weeks.  Holiday due to cervical bleeding.  No bleeding since it was removed.  H/o pessary ring #6 use.  Prior expulsion w smaller size.  PMHx: She  has a past medical history of Breast cancer ([place]) (2011), Chronic kidney disease, Colitis, Diverticulitis, Hyperchloremia, Hypertension, Hypothyroidism, and Pre-diabetes. Also,  has a past surgical history that includes Colonoscopy; Breast lumpectomy (Left, 02/2009); Breast surgery (Right, 02/2009); Appendectomy (1950); and Bunionectomy (Left, 03/25/2018)., family history includes Breast cancer in her cousin; Cancer in her paternal uncle; Migraines in her maternal grandmother; Pancreatic cancer in her father; Parkinson's disease in her mother.,  reports that she has never smoked. She has never used smokeless tobacco. She reports that she does not drink alcohol or use drugs.  She has a current medication list which includes the following prescription(s): anastrozole, aspirin ec, cetirizine, cholecalciferol, cinnamon, duloxetine, levothyroxine, lisinopril, loperamide, pantoprazole, simethicone, simvastatin, and trimethoprim. Also, is allergic to cephalexin; ciprofloxacin; and metronidazole.  Review of Systems  All other systems reviewed and are negative.   Objective: BP 100/60   Ht 5\' 2"  (1.575 m)   Wt 155 lb (70.3 kg)   BMI 28.35 kg/m  Physical Exam Constitutional:      General: She is not in acute distress.    Appearance: She is well-developed.  Genitourinary:     Pelvic exam was performed with patient supine.     Vagina and uterus normal.     No vaginal erythema or bleeding.     No cervical motion tenderness, discharge, polyp or nabothian cyst.     Uterus is mobile.     Uterus is not enlarged.     No uterine mass detected.  Uterus is midaxial.     No right or left adnexal mass present.     Right adnexa not tender.     Left adnexa not tender.     Genitourinary Comments: No bleeding on cervix.  Gr 3 POP and cystocele  Pessary #6 fits, would not facilitate any larger size.  HENT:     Head: Normocephalic and atraumatic.     Nose: Nose normal.  Abdominal:     General: There is no distension.     Palpations: Abdomen is soft.     Tenderness: There is no abdominal tenderness.  Musculoskeletal: Normal range of motion.  Neurological:     Mental Status: She is alert and oriented to person, place, and time.     Cranial Nerves: No cranial nerve deficit.  Skin:    General: Skin is warm and dry.  Psychiatric:        Attention and Perception: Attention normal.        Mood and Affect: Mood and affect normal.        Speech: Speech normal.        Behavior: Behavior normal.        Thought Content: Thought content normal.        Judgment: Judgment normal.     Pessary Care Vagina and cervix checked - without erosions - pessary replaced.  A/P:   ICD-10-CM   1. 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 Incontinence of urine in female  R32     Pessary was cleaned and replaced today. Instructions given for  care. Concerning symptoms to observe for are counseled to patient. Follow up scheduled for 3 months.  A total of 15 minutes were spent face-to-face with the patient during this encounter and over half of that time dealt with counseling and coordination of care.  Barnett Applebaum, MD, [facility], [facility] 10/17/2018  2:06 PM

## 2018-12-02 ENCOUNTER — Ambulatory Visit: Payer: Medicare HMO | Admitting: Obstetrics & Gynecology

## 2018-12-06 IMAGING — CR DG ABDOMEN ACUTE W/ 1V CHEST
3 series · 3 of 3 positions shown · non-contrast
Comparison: CT of the abdomen and pelvis on 12/18/2016

CLINICAL DATA: Pt reports that she has been nauseated since
yesterday. States after cooking dinner last night, she started
feeling "really hot" and couldn't cool down. She has also been
traveling locally for the last several weeks. Her son believes she
is experiencing fatigue. Chest pain.

EXAM:
DG ABDOMEN ACUTE W/ 1V CHEST

[chest pa]
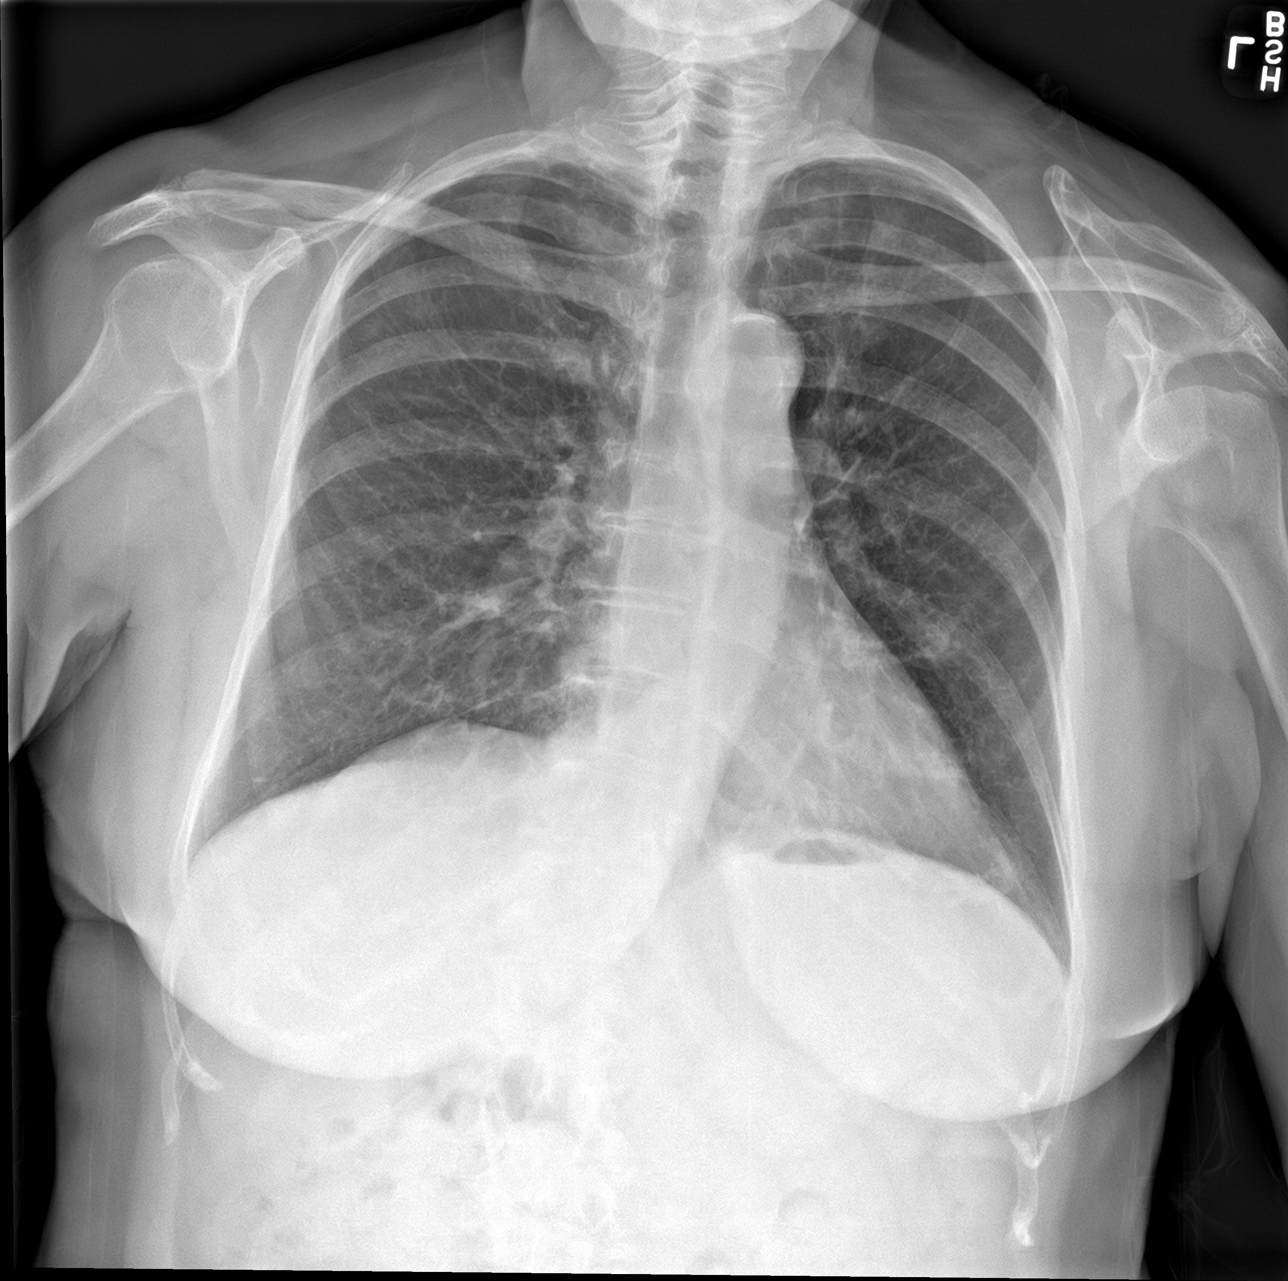

[abdomen erect]
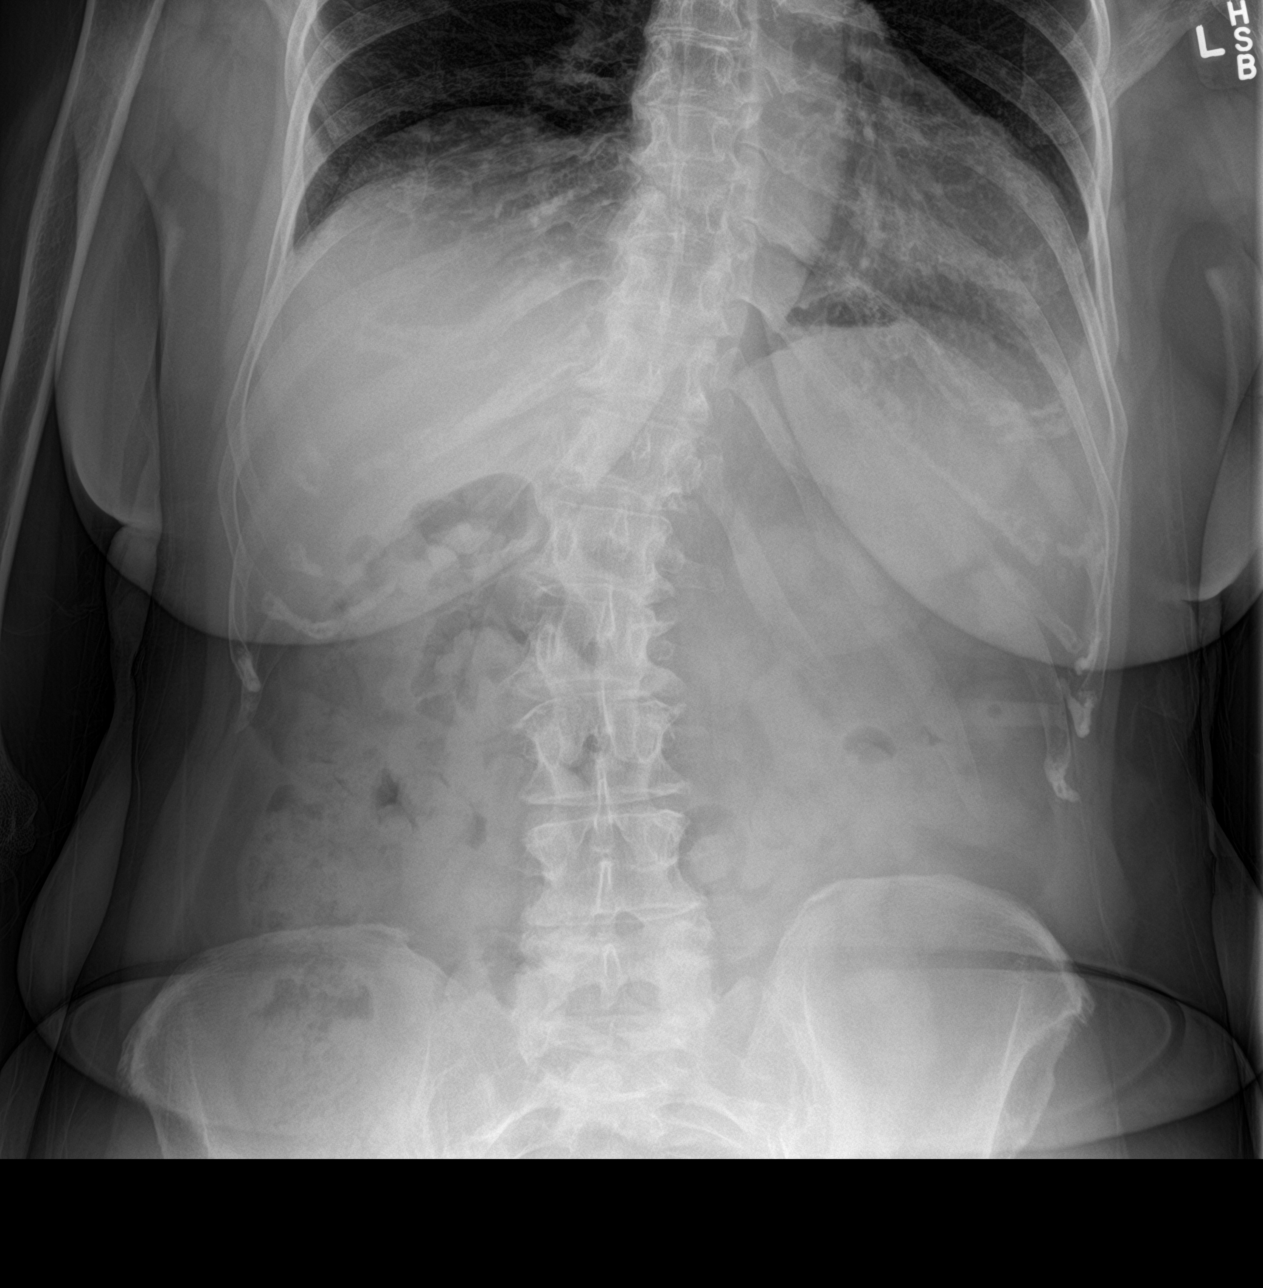

[abdomen supine]
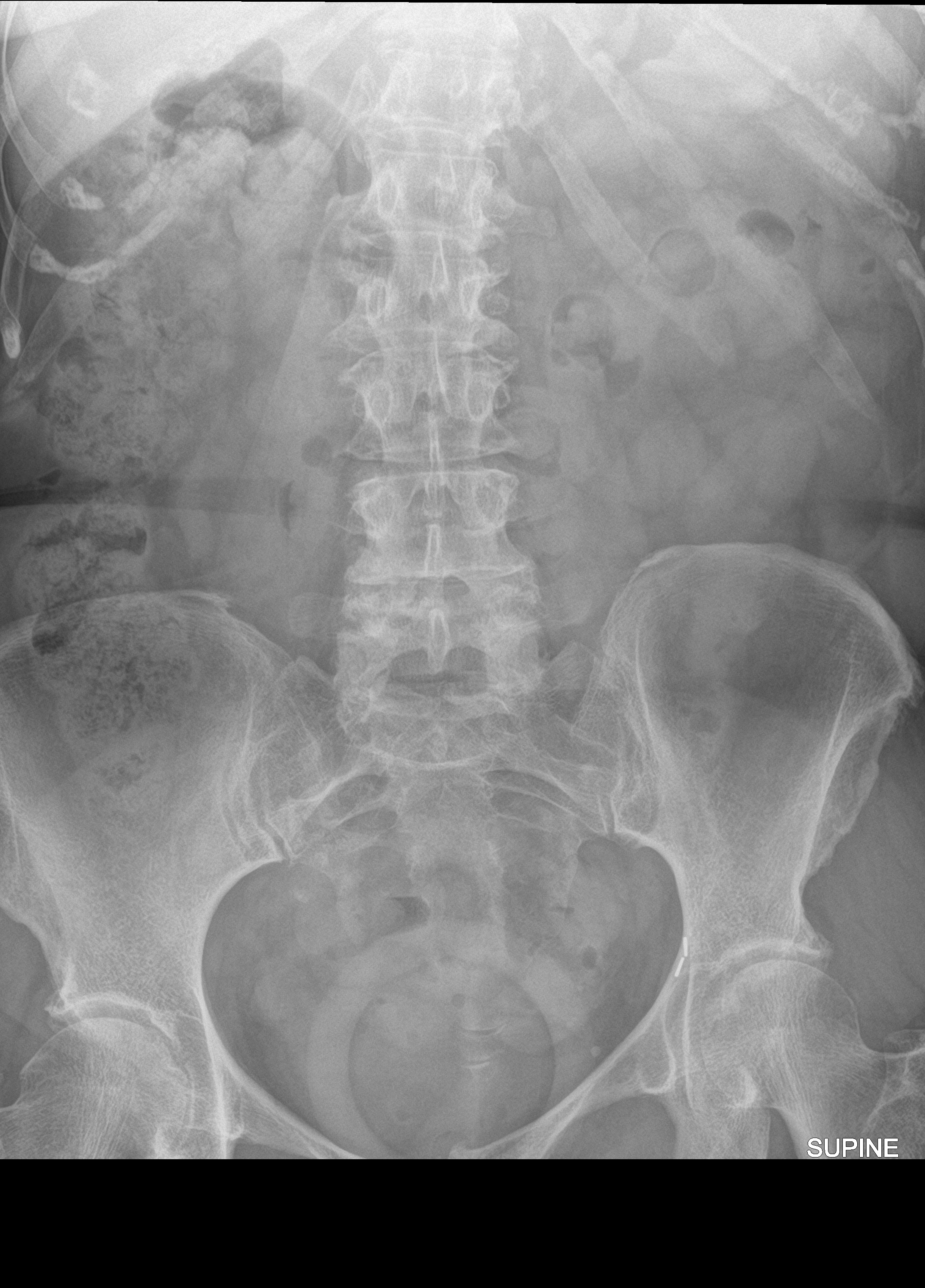

[3 of 3 positions shown; findings below may reference images not displayed]

FINDINGS: Heart size is normal. The lungs are free of focal consolidations and
pleural effusions. No pulmonary edema. There is question of a nodule
overlying the RIGHT lung apex, measuring 9 millimeters. Although
possibly related to biapical pleuroparenchymal changes, is difficult
to entirely exclude a lung lesion.

There is no free intraperitoneal air beneath diaphragm.Supine and
erect views of the abdomen show a nonobstructive bowel gas pattern.
There is stable elevation of the RIGHT hemidiaphragm. Moderate stool
burden. There are mild degenerative changes in the spine.
IMPRESSION: 1. Nonobstructive bowel gas pattern.
2. Question of RIGHT apical pulmonary nodule. Recommend follow-up CT
chest.

## 2018-12-14 ENCOUNTER — Ambulatory Visit (INDEPENDENT_AMBULATORY_CARE_PROVIDER_SITE_OTHER): Payer: Medicare HMO | Admitting: Obstetrics & Gynecology

## 2018-12-14 ENCOUNTER — Encounter: Payer: Self-pay | Admitting: Obstetrics & Gynecology

## 2018-12-14 VITALS — BP 110/60 | Ht 62.0 in | Wt 158.0 lb

## 2018-12-14 DIAGNOSIS — N814 Uterovaginal prolapse, unspecified: Secondary | ICD-10-CM

## 2018-12-14 NOTE — Progress Notes (Signed)
HPI:      [name] is a 78 y.o. [phone] who presents today for her pessary follow up and examination related to her pelvic floor weakening.  Pt reports tolerating the pessary well with onset again of vaginal bleeding yesterday.  Symptoms of pelvic floor weakening have greatly improved when pessary ring w support #6 is in place.  Had bleeding episode a few months ago requiring pessary holiday. She is voiding and defecating without difficulty.   PMHx: She  has a past medical history of Breast cancer ([place]) (2011), Chronic kidney disease, Colitis, Diverticulitis, Hyperchloremia, Hypertension, Hypothyroidism, and Pre-diabetes. Also,  has a past surgical history that includes Colonoscopy; Breast lumpectomy (Left, 02/2009); Breast surgery (Right, 02/2009); Appendectomy (1950); and Bunionectomy (Left, 03/25/2018)., family history includes Breast cancer in her cousin; Cancer in her paternal uncle; Migraines in her maternal grandmother; Pancreatic cancer in her father; Parkinson's disease in her mother.,  reports that she has never smoked. She has never used smokeless tobacco. She reports that she does not drink alcohol or use drugs.  She has a current medication list which includes the following prescription(s): anastrozole, aspirin ec, cetirizine, cholecalciferol, cinnamon, duloxetine, levothyroxine, lisinopril, loperamide, pantoprazole, simethicone, simvastatin, and trimethoprim. Also, is allergic to cephalexin; ciprofloxacin; and metronidazole.  Review of Systems  All other systems reviewed and are negative.   Objective: BP 110/60   Ht 5\' 2"  (1.575 m)   Wt 158 lb (71.7 kg)   BMI 28.90 kg/m  Physical Exam Constitutional:      General: She is not in acute distress.    Appearance: She is well-developed.  Genitourinary:     Pelvic exam was performed with patient supine.     Vagina and uterus normal.     No vaginal erythema or bleeding.     Cervical bleeding present.     No cervical motion  tenderness, discharge, polyp or nabothian cyst.     Uterus is mobile.     Uterus is not enlarged.     No uterine mass detected.    Uterus is midaxial.     No right or left adnexal mass present.     Right adnexa not tender.     Left adnexa not tender.     Genitourinary Comments: Tissues healthy Cervix w spotty bleeding on ectocervix No erosion, ulcerations, laceration, abrasions Gr 3 cystocele, Gr 3 uterine descensus  HENT:     Head: Normocephalic and atraumatic.     Nose: Nose normal.  Abdominal:     General: There is no distension.     Palpations: Abdomen is soft.     Tenderness: There is no abdominal tenderness.  Musculoskeletal: Normal range of motion.  Neurological:     Mental Status: She is alert and oriented to person, place, and time.     Cranial Nerves: No cranial nerve deficit.  Skin:    General: Skin is warm and dry.  Psychiatric:        Attention and Perception: Attention normal.        Mood and Affect: Mood and affect normal.        Speech: Speech normal.        Behavior: Behavior normal.        Thought Content: Thought content normal.        Judgment: Judgment normal.     Pessary Care Pessary removed and cleaned.  Vagina checked - without erosions, bleeding from cervix surface.  A/P: 1. Cystocele with uterine descensus 2. Vaginal/cervical bleeding  Pessary was cleaned and not replaced today. Will order ring WITHOUT support to try alternative option. Also discussed TVH AR as alternative approach to her symptomatic cystocele and uterine prolapse. Pessary holiday advised.  Will leave out and follow symptoms closely.  Replace new type pessary in 4 weeks.  A total of 15 minutes were spent face-to-face with the patient during this encounter and over half of that time dealt with counseling and coordination of care.  Barnett Applebaum, MD, [facility], [facility] 12/14/2018  8:06 AM

## 2019-01-12 ENCOUNTER — Other Ambulatory Visit: Payer: Self-pay

## 2019-01-12 ENCOUNTER — Ambulatory Visit (INDEPENDENT_AMBULATORY_CARE_PROVIDER_SITE_OTHER): Payer: Medicare HMO | Admitting: Obstetrics & Gynecology

## 2019-01-12 ENCOUNTER — Encounter: Payer: Self-pay | Admitting: Obstetrics & Gynecology

## 2019-01-12 VITALS — BP 120/80 | Ht 62.0 in | Wt 160.0 lb

## 2019-01-12 DIAGNOSIS — N814 Uterovaginal prolapse, unspecified: Secondary | ICD-10-CM

## 2019-01-12 DIAGNOSIS — R32 Unspecified urinary incontinence: Secondary | ICD-10-CM | POA: Diagnosis not present

## 2019-01-12 NOTE — Progress Notes (Signed)
HPI:      [name] is a 78 y.o. [phone] who presents today for her pessary follow up and examination related to her pelvic floor weakening.  Pt has been on pessary holiday and has new pessary to place today. SHe feels prolapse every time she voids, has to push back in  PMHx: She  has a past medical history of Breast cancer ([place]) (2011), Chronic kidney disease, Colitis, Diverticulitis, Hyperchloremia, Hypertension, Hypothyroidism, and Pre-diabetes. Also,  has a past surgical history that includes Colonoscopy; Breast lumpectomy (Left, 02/2009); Breast surgery (Right, 02/2009); Appendectomy (1950); and Bunionectomy (Left, 03/25/2018)., family history includes Breast cancer in her cousin; Cancer in her paternal uncle; Migraines in her maternal grandmother; Pancreatic cancer in her father; Parkinson's disease in her mother.,  reports that she has never smoked. She has never used smokeless tobacco. She reports that she does not drink alcohol or use drugs.  She has a current medication list which includes the following prescription(s): anastrozole, aspirin ec, cetirizine, cholecalciferol, cinnamon, duloxetine, levothyroxine, lisinopril, loperamide, pantoprazole, simethicone, simvastatin, and trimethoprim. Also, is allergic to cephalexin; ciprofloxacin; and metronidazole.  Review of Systems  All other systems reviewed and are negative.   Objective: BP 120/80   Ht 5\' 2"  (1.575 m)   Wt 160 lb (72.6 kg)   BMI 29.26 kg/m  Physical Exam Constitutional:      General: She is not in acute distress.    Appearance: She is well-developed.  Genitourinary:     Pelvic exam was performed with patient supine.     Vagina and uterus normal.     No vaginal erythema or bleeding.     No cervical motion tenderness, discharge, polyp or nabothian cyst.     Uterus is mobile.     Uterus is not enlarged.     No uterine mass detected.    Uterus is midaxial.     No right or left adnexal mass present.     Right  adnexa not tender.     Left adnexa not tender.     Genitourinary Comments: Gr 3 POP and cystocele  HENT:     Head: Normocephalic and atraumatic.     Nose: Nose normal.  Abdominal:     General: There is no distension.     Palpations: Abdomen is soft.     Tenderness: There is no abdominal tenderness.  Musculoskeletal:        General: Normal range of motion.  Neurological:     Mental Status: She is alert and oriented to person, place, and time.     Cranial Nerves: No cranial nerve deficit.  Skin:    General: Skin is warm and dry.  Psychiatric:        Attention and Perception: Attention normal.        Mood and Affect: Mood and affect normal.        Speech: Speech normal.        Behavior: Behavior normal.        Thought Content: Thought content normal.        Judgment: Judgment normal.     Pessary Care Pessary removed and cleaned.  Vagina checked - without erosions - pessary replaced.  A/P:   ICD-10-CM   1. 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 Incontinence of urine in female  R32    New Ring #6 without support Pessary was placed today. Instructions given for care. Concerning symptoms to observe for are counseled to patient. Follow up  scheduled for 3 months.  A total of 15 minutes were spent face-to-face with the patient during this encounter and over half of that time dealt with counseling and coordination of care.  Barnett Applebaum, MD, [facility], [facility] 01/12/2019  8:09 AM

## 2019-01-23 ENCOUNTER — Ambulatory Visit: Payer: Medicare HMO | Admitting: Obstetrics & Gynecology

## 2019-04-12 ENCOUNTER — Encounter: Payer: Self-pay | Admitting: Obstetrics & Gynecology

## 2019-04-12 ENCOUNTER — Other Ambulatory Visit: Payer: Self-pay

## 2019-04-12 ENCOUNTER — Ambulatory Visit (INDEPENDENT_AMBULATORY_CARE_PROVIDER_SITE_OTHER): Payer: Medicare HMO | Admitting: Obstetrics & Gynecology

## 2019-04-12 VITALS — BP 120/80 | Ht 62.0 in | Wt 158.0 lb

## 2019-04-12 DIAGNOSIS — R32 Unspecified urinary incontinence: Secondary | ICD-10-CM

## 2019-04-12 DIAGNOSIS — N814 Uterovaginal prolapse, unspecified: Secondary | ICD-10-CM

## 2019-04-12 NOTE — Progress Notes (Signed)
HPI:      [name] is a 79 y.o. [phone] who presents today for her pessary follow up and examination related to her pelvic floor weakening.  Pt reports tolerating the pessary well with  no vaginal bleeding and  no vaginal discharge.  Symptoms of pelvic floor weakening have greatly improved. She is voiding and defecating without difficulty ALTHOUGH she does have frequent urinary leakage. She currently has a Ring #6 without support pessary.  No bleeding and better than last pessary.  PMHx: She  has a past medical history of Breast cancer ([place]) (2011), Chronic kidney disease, Colitis, Diverticulitis, Hyperchloremia, Hypertension, Hypothyroidism, and Pre-diabetes. Also,  has a past surgical history that includes Colonoscopy; Breast lumpectomy (Left, 02/2009); Breast surgery (Right, 02/2009); Appendectomy (1950); and Bunionectomy (Left, 03/25/2018)., family history includes Breast cancer in her cousin; Cancer in her paternal uncle; Migraines in her maternal grandmother; Pancreatic cancer in her father; Parkinson's disease in her mother.,  reports that she has never smoked. She has never used smokeless tobacco. She reports that she does not drink alcohol or use drugs.  She has a current medication list which includes the following prescription(s): anastrozole, aspirin ec, cetirizine, cholecalciferol, cinnamon, duloxetine, levothyroxine, lisinopril, loperamide, pantoprazole, simethicone, simvastatin, and trimethoprim. Also, is allergic to cephalexin; ciprofloxacin; and metronidazole.  Review of Systems  All other systems reviewed and are negative.   Objective: BP 120/80   Ht 5\' 2"  (1.575 m)   Wt 158 lb (71.7 kg)   BMI 28.90 kg/m  Physical Exam Constitutional:      General: She is not in acute distress.    Appearance: She is well-developed.  Genitourinary:     Pelvic exam was performed with patient supine.     Vagina and uterus normal.     No vaginal erythema or bleeding.     No cervical  motion tenderness, discharge, polyp or nabothian cyst.     Uterus is mobile.     Uterus is not enlarged.     No uterine mass detected.    Uterus is midaxial.     No right or left adnexal mass present.     Right adnexa not tender.     Left adnexa not tender.     Genitourinary Comments: Gr 3 POP Gr 3 Cystocele  HENT:     Head: Normocephalic and atraumatic.     Nose: Nose normal.  Abdominal:     General: There is no distension.     Palpations: Abdomen is soft.     Tenderness: There is no abdominal tenderness.  Musculoskeletal:        General: Normal range of motion.  Neurological:     Mental Status: She is alert and oriented to person, place, and time.     Cranial Nerves: No cranial nerve deficit.  Skin:    General: Skin is warm and dry.  Psychiatric:        Attention and Perception: Attention normal.        Mood and Affect: Mood and affect normal.        Speech: Speech normal.        Behavior: Behavior normal.        Thought Content: Thought content normal.        Judgment: Judgment normal.     Pessary Care Pessary removed and cleaned.  Vagina checked - without erosions - pessary replaced.  A/P:   ICD-10-CM   1. 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 Incontinence of  urine in female  R32    Pessary was cleaned and replaced today. Instructions given for care. Concerning symptoms to observe for are counseled to patient. Follow up scheduled for 3 months. Discussed TVH, AR TVT for urinary leakage and surgical treatment of her prolapse and incontinence  A total of 20 minutes were spent face-to-face with the patient as well as preparation, review, communication, and documentation during this encounter.   Barnett Applebaum, MD, [facility], [facility] 04/12/2019  9:06 AM

## 2019-07-13 ENCOUNTER — Ambulatory Visit (INDEPENDENT_AMBULATORY_CARE_PROVIDER_SITE_OTHER): Payer: Medicare HMO | Admitting: Obstetrics & Gynecology

## 2019-07-13 ENCOUNTER — Encounter: Payer: Self-pay | Admitting: Obstetrics & Gynecology

## 2019-07-13 ENCOUNTER — Other Ambulatory Visit: Payer: Self-pay

## 2019-07-13 VITALS — BP 100/60 | Ht 62.0 in | Wt 153.0 lb

## 2019-07-13 DIAGNOSIS — N814 Uterovaginal prolapse, unspecified: Secondary | ICD-10-CM

## 2019-07-13 DIAGNOSIS — R32 Unspecified urinary incontinence: Secondary | ICD-10-CM

## 2019-07-13 NOTE — Progress Notes (Signed)
HPI:      [name] is a 79 y.o. [phone] who presents today for her pessary follow up and examination related to her pelvic floor weakening.  Pt reports tolerating the pessary well with  no vaginal bleeding and  no vaginal discharge.  Symptoms of pelvic floor weakening have greatly improved. She is voiding and defecating without difficulty. She currently has a Ring #6 pessary.  No change in urinary freq concerns, does not desire surgery at this time.  PMHx: She  has a past medical history of Breast cancer ([place]) (2011), Chronic kidney disease, Colitis, Diverticulitis, Hyperchloremia, Hypertension, Hypothyroidism, and Pre-diabetes. Also,  has a past surgical history that includes Colonoscopy; Breast lumpectomy (Left, 02/2009); Breast surgery (Right, 02/2009); Appendectomy (1950); and Bunionectomy (Left, 03/25/2018)., family history includes Breast cancer in her cousin; Cancer in her paternal uncle; Migraines in her maternal grandmother; Pancreatic cancer in her father; Parkinson's disease in her mother.,  reports that she has never smoked. She has never used smokeless tobacco. She reports that she does not drink alcohol and does not use drugs.  She has a current medication list which includes the following prescription(s): anastrozole, aspirin ec, cetirizine, cholecalciferol, cinnamon, duloxetine, levothyroxine, lisinopril, loperamide, nitrofurantoin, pantoprazole, simethicone, simvastatin, and trimethoprim. Also, is allergic to cephalexin, ciprofloxacin, and metronidazole.  Review of Systems  All other systems reviewed and are negative.   Objective: BP 100/60   Ht 5\' 2"  (1.575 m)   Wt 153 lb (69.4 kg)   BMI 27.98 kg/m  Physical Exam Constitutional:      General: She is not in acute distress.    Appearance: She is well-developed.  Genitourinary:     Pelvic exam was performed with patient supine.     Vagina and uterus normal.     No vaginal erythema or bleeding.     No cervical motion  tenderness, discharge, polyp or nabothian cyst.     Uterus is mobile.     Uterus is not enlarged.     No uterine mass detected.    Uterus is midaxial.     No right or left adnexal mass present.     Right adnexa not tender.     Left adnexa not tender.     Genitourinary Comments: Gr 3 POP Gr 3 Cystocele  Min vag atrophy  HENT:     Head: Normocephalic and atraumatic.     Nose: Nose normal.  Abdominal:     General: There is no distension.     Palpations: Abdomen is soft.     Tenderness: There is no abdominal tenderness.  Musculoskeletal:        General: Normal range of motion.  Neurological:     Mental Status: She is alert and oriented to person, place, and time.     Cranial Nerves: No cranial nerve deficit.  Skin:    General: Skin is warm and dry.  Psychiatric:        Attention and Perception: Attention normal.        Mood and Affect: Mood and affect normal.        Speech: Speech normal.        Behavior: Behavior normal.        Thought Content: Thought content normal.        Judgment: Judgment normal.     Pessary Care Pessary removed and cleaned.  Vagina checked - without erosions - pessary replaced.  A/P:   ICD-10-CM   1. Cystocele with uterine descensus  N81.4  2. Incontinence of urine in female  R32    Pessary was cleaned and replaced today. Instructions given for care. Concerning symptoms to observe for are counseled to patient. Follow up scheduled for 3 months.  A total of 20 minutes were spent face-to-face with the patient as well as preparation, review, communication, and documentation during this encounter.   Barnett Applebaum, MD, [facility], [facility] 07/13/2019  9:09 AM

## 2019-10-16 ENCOUNTER — Other Ambulatory Visit (HOSPITAL_COMMUNITY)
Admission: RE | Admit: 2019-10-16 | Discharge: 2019-10-16 | Disposition: A | Discharge status: Home / Self Care | Payer: Medicare HMO | Source: Ambulatory Visit | Attending: Obstetrics & Gynecology | Admitting: Obstetrics & Gynecology

## 2019-10-16 ENCOUNTER — Encounter: Payer: Self-pay | Admitting: Obstetrics & Gynecology

## 2019-10-16 ENCOUNTER — Other Ambulatory Visit: Payer: Self-pay

## 2019-10-16 ENCOUNTER — Ambulatory Visit (INDEPENDENT_AMBULATORY_CARE_PROVIDER_SITE_OTHER): Payer: Medicare HMO | Admitting: Obstetrics & Gynecology

## 2019-10-16 VITALS — BP 100/70 | Ht 62.0 in | Wt 148.0 lb

## 2019-10-16 DIAGNOSIS — Z124 Encounter for screening for malignant neoplasm of cervix: Secondary | ICD-10-CM | POA: Insufficient documentation

## 2019-10-16 DIAGNOSIS — N814 Uterovaginal prolapse, unspecified: Secondary | ICD-10-CM

## 2019-10-16 NOTE — Patient Instructions (Signed)
Thank you for choosing [facility]. As part of our ongoing efforts to improve patient experience, we would appreciate your feedback. Please fill out the short survey that you will receive by mail or MyChart. Your opinion is important to us! -Dr Alejandria Wessells  

## 2019-10-16 NOTE — Progress Notes (Signed)
HPI:      [name] is a 79 y.o. [phone] who presents today for her pessary follow up and examination related to her pelvic floor weakening.  Pt reports tolerating the pessary well with no vaginal bleeding and no vaginal discharge.  Symptoms of pelvic floor weakening have greatly improved. She is voiding and defecating without difficulty. She currently has a Ring #6 pessary.  She has uterine prolapse as well as sx's of urinary incontinence w partial relief of GSI w pessary.  PMHx: She  has a past medical history of Breast cancer ([place]) (2011), Chronic kidney disease, Colitis, Diverticulitis, Hyperchloremia, Hypertension, Hypothyroidism, and Pre-diabetes. Also,  has a past surgical history that includes Colonoscopy; Breast lumpectomy (Left, 02/2009); Breast surgery (Right, 02/2009); Appendectomy (1950); and Bunionectomy (Left, 03/25/2018)., family history includes Breast cancer in her cousin; Cancer in her paternal uncle; Migraines in her maternal grandmother; Pancreatic cancer in her father; Parkinson's disease in her mother.,  reports that she has never smoked. She has never used smokeless tobacco. She reports that she does not drink alcohol and does not use drugs.  She has a current medication list which includes the following prescription(s): anastrozole, aspirin ec, cetirizine, cholecalciferol, cinnamon, duloxetine, levothyroxine, lisinopril, loperamide, nitrofurantoin, pantoprazole, simethicone, simvastatin, and trimethoprim. Also, is allergic to cephalexin, ciprofloxacin, and metronidazole.  Review of Systems  All other systems reviewed and are negative.   Objective: BP 100/70    Ht 5\' 2"  (1.575 m)    Wt 148 lb (67.1 kg)    BMI 27.07 kg/m  Physical Exam Constitutional:      General: She is not in acute distress.    Appearance: She is well-developed.  Genitourinary:     Pelvic exam was performed with patient supine.     Vagina and uterus normal.     No vaginal erythema or bleeding.       No cervical motion tenderness, discharge, polyp or nabothian cyst.     Uterus is mobile.     Uterus is not enlarged.     No uterine mass detected.    Uterus is midaxial.     No right or left adnexal mass present.     Right adnexa not tender.     Left adnexa not tender.     Genitourinary Comments: Gr 3 POP Gr 3 Cystocele  Min vag atrophy   HENT:     Head: Normocephalic and atraumatic.     Nose: Nose normal.  Abdominal:     General: There is no distension.     Palpations: Abdomen is soft.     Tenderness: There is no abdominal tenderness.  Musculoskeletal:        General: Normal range of motion.  Neurological:     Mental Status: She is alert and oriented to person, place, and time.     Cranial Nerves: No cranial nerve deficit.  Skin:    General: Skin is warm and dry.  Psychiatric:        Attention and Perception: Attention normal.        Mood and Affect: Mood and affect normal.        Speech: Speech normal.        Behavior: Behavior normal.        Thought Content: Thought content normal.        Judgment: Judgment normal.     Pessary Care Pessary removed and cleaned.  Vagina checked - without erosions - pessary replaced (w a new one).  A/P:  ICD-10-CM   1. 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 Screening for cervical cancer  Z12.4 Cytology - PAP   Pessary was cleaned and a new pessary was placed today.    Incontinence ring w support #6    This to try to improve incontinence prevention    Options for surgery also discussed, declined at this time Instructions given for care. Concerning symptoms to observe for are counseled to patient. Follow up scheduled for 3 months.  A total of 20 minutes were spent face-to-face with the patient as well as preparation, review, communication, and documentation during this encounter.   Barnett Applebaum, MD, [facility], [facility] 10/16/2019  9:19 AM

## 2019-10-17 LAB — CYTOLOGY - PAP: Diagnosis: NEGATIVE

## 2020-01-17 ENCOUNTER — Other Ambulatory Visit: Payer: Self-pay

## 2020-01-17 ENCOUNTER — Ambulatory Visit (INDEPENDENT_AMBULATORY_CARE_PROVIDER_SITE_OTHER): Payer: Medicare HMO | Admitting: Obstetrics & Gynecology

## 2020-01-17 ENCOUNTER — Encounter: Payer: Self-pay | Admitting: Obstetrics & Gynecology

## 2020-01-17 VITALS — BP 108/60 | HR 76 | Ht 62.0 in | Wt 148.0 lb

## 2020-01-17 DIAGNOSIS — N814 Uterovaginal prolapse, unspecified: Secondary | ICD-10-CM | POA: Diagnosis not present

## 2020-01-17 NOTE — Progress Notes (Signed)
HPI:      [name] is a 80 y.o. [phone] who presents today for her pessary follow up and examination related to her pelvic floor weakening.  Pt reports tolerating the pessary well with no vaginal bleeding and no vaginal discharge.  Symptoms of pelvic floor weakening have greatly improved. She is voiding and defecating without difficulty. She currently has a Ring #6 pessary.  PMHx: She  has a past medical history of Breast cancer ([facility]) (2011), Chronic kidney disease, Colitis, Diverticulitis, Hyperchloremia, Hypertension, Hypothyroidism, and Pre-diabetes. Also,  has a past surgical history that includes Colonoscopy; Breast lumpectomy (Left, 02/2009); Breast surgery (Right, 02/2009); Appendectomy (1950); and Bunionectomy (Left, 03/25/2018)., family history includes Breast cancer in her cousin; Cancer in her paternal uncle; Migraines in her maternal grandmother; Pancreatic cancer in her father; Parkinson's disease in her mother.,  reports that she has never smoked. She has never used smokeless tobacco. She reports that she does not drink alcohol and does not use drugs.  She has a current medication list which includes the following prescription(s): anastrozole, aspirin ec, cetirizine, cholecalciferol, cinnamon, duloxetine, levothyroxine, lisinopril, pantoprazole, simvastatin, and trimethoprim. Also, is allergic to cephalexin, ciprofloxacin, and metronidazole.  Review of Systems  All other systems reviewed and are negative.   Objective: BP 108/60   Pulse 76   Ht 5\' 2"  (1.575 m)   Wt 148 lb (67.1 kg)   SpO2 98%   BMI 27.07 kg/m  Physical Exam Constitutional:      General: She is not in acute distress.    Appearance: She is well-developed.  Genitourinary:     Vagina and uterus normal.     No vaginal erythema or bleeding.      Right Adnexa: not tender and no mass present.    Left Adnexa: not tender and no mass present.    No cervical motion tenderness, discharge, polyp or nabothian  cyst.     Uterus is mobile.     Uterus is not enlarged.     No uterine mass detected.    Uterus exam comments: Gr 3 POP Gr 3 Cystocele .     Uterus is midaxial.     Pelvic exam was performed with patient supine.  HENT:     Head: Normocephalic and atraumatic.     Nose: Nose normal.  Abdominal:     General: There is no distension.     Palpations: Abdomen is soft.     Tenderness: There is no abdominal tenderness.  Musculoskeletal:        General: Normal range of motion.  Neurological:     Mental Status: She is alert and oriented to person, place, and time.     Cranial Nerves: No cranial nerve deficit.  Skin:    General: Skin is warm and dry.  Psychiatric:        Attention and Perception: Attention normal.        Mood and Affect: Mood and affect normal.        Speech: Speech normal.        Behavior: Behavior normal.        Thought Content: Thought content normal.        Judgment: Judgment normal.     Pessary Care Pessary removed and cleaned.  Vagina checked - without erosions - pessary replaced.  A/P:   ICD-10-CM   1. Cystocele with uterine descensus  N81.4    Pessary was cleaned and replaced today. Instructions given for care. Concerning symptoms to observe for  are counseled to patient. Follow up scheduled for 3 months.  A total of 20 minutes were spent face-to-face with the patient as well as preparation, review, communication, and documentation during this encounter.   Barnett Applebaum, MD, [facility], [facility] 01/17/2020  8:53 AM

## 2020-04-16 ENCOUNTER — Encounter: Payer: Self-pay | Admitting: Obstetrics & Gynecology

## 2020-04-16 ENCOUNTER — Other Ambulatory Visit: Payer: Self-pay

## 2020-04-16 ENCOUNTER — Ambulatory Visit (INDEPENDENT_AMBULATORY_CARE_PROVIDER_SITE_OTHER): Payer: Medicare HMO | Admitting: Obstetrics & Gynecology

## 2020-04-16 VITALS — BP 122/78 | Ht 62.0 in | Wt 154.0 lb

## 2020-04-16 DIAGNOSIS — N814 Uterovaginal prolapse, unspecified: Secondary | ICD-10-CM | POA: Diagnosis not present

## 2020-04-16 NOTE — Progress Notes (Signed)
HPI: [name] is a 80 y.o. [phone] who presents today for her pessary follow up and examination related to her pelvic floor weakening.  Pt reports tolerating the pessary well with no vaginal bleeding and no vaginal discharge.  Symptoms of pelvic floor weakening have greatly improved. She is voiding and defecating without difficulty. She currently has a Research scientist (physical sciences) #6 pessary.  PMHx: She  has a past medical history of Breast cancer ([place]) (2011), Chronic kidney disease, Colitis, Diverticulitis, Hyperchloremia, Hypertension, Hypothyroidism, and Pre-diabetes. Also,  has a past surgical history that includes Colonoscopy; Breast lumpectomy (Left, 02/2009); Breast surgery (Right, 02/2009); Appendectomy (1950); and Bunionectomy (Left, 03/25/2018)., family history includes Breast cancer in her cousin; Cancer in her paternal uncle; Migraines in her maternal grandmother; Pancreatic cancer in her father; Parkinson's disease in her mother.,  reports that she has never smoked. She has never used smokeless tobacco. She reports that she does not drink alcohol and does not use drugs.  She has a current medication list which includes the following prescription(s): anastrozole, aspirin ec, cetirizine, cholecalciferol, cinnamon, duloxetine, levothyroxine, lisinopril, pantoprazole, simvastatin, and trimethoprim. Also, is allergic to cephalexin, ciprofloxacin, and metronidazole.  Review of Systems  All other systems reviewed and are negative.   Objective: BP 122/78   Ht 5\' 2"  (1.575 m)   Wt 154 lb (69.9 kg)   BMI 28.17 kg/m  Physical Exam Constitutional:      General: She is not in acute distress.    Appearance: She is well-developed.  Genitourinary:     Bladder and urethral meatus normal.     No vaginal erythema or bleeding.      Right Adnexa: not tender and no mass present.    Left Adnexa: not tender and no mass present.    No cervical motion tenderness, discharge, polyp or nabothian cyst.      Uterus is prolapsed.     Uterus is not enlarged.     No uterine mass detected.    Uterus is midaxial.     Bladder exam comments: Cystocele Gr2.     Pelvic exam was performed with patient in the lithotomy position.  HENT:     Head: Normocephalic and atraumatic.     Nose: Nose normal.  Abdominal:     General: There is no distension.     Palpations: Abdomen is soft.     Tenderness: There is no abdominal tenderness.  Musculoskeletal:        General: Normal range of motion.  Neurological:     Mental Status: She is alert and oriented to person, place, and time.     Cranial Nerves: No cranial nerve deficit.  Skin:    General: Skin is warm and dry.  Psychiatric:        Attention and Perception: Attention normal.        Mood and Affect: Mood and affect normal.        Speech: Speech normal.        Behavior: Behavior normal.        Thought Content: Thought content normal.        Judgment: Judgment normal.     Pessary Care Pessary removed and cleaned.  Vagina checked - without erosions - pessary replaced.  A/P:1. Cystocele with uterine descensus Pessary was cleaned and replaced today. Instructions given for care. Concerning symptoms to observe for are counseled to patient. Follow up scheduled for 3 months.  A total of 20 minutes were spent face-to-face with the patient as well  as preparation, review, communication, and documentation during this encounter.   Barnett Applebaum, MD, [facility], [facility] 04/16/2020  8:37 AM

## 2020-07-18 ENCOUNTER — Ambulatory Visit: Payer: Medicare HMO | Admitting: Obstetrics & Gynecology

## 2020-07-18 ENCOUNTER — Other Ambulatory Visit: Payer: Self-pay

## 2020-07-18 ENCOUNTER — Encounter: Payer: Self-pay | Admitting: Obstetrics & Gynecology

## 2020-07-18 VITALS — BP 120/80 | Ht 62.0 in | Wt 151.0 lb

## 2020-07-18 DIAGNOSIS — N814 Uterovaginal prolapse, unspecified: Secondary | ICD-10-CM

## 2020-07-18 NOTE — Progress Notes (Signed)
HPI:      [name] is a 80 y.o. [phone] who presents today for her pessary follow up and examination related to her pelvic floor weakening.  Pt reports tolerating the pessary well with no vaginal bleeding and no vaginal discharge.  Symptoms of pelvic floor weakening have greatly improved. She is voiding and defecating without difficulty. She currently has a Incont RIng #6 pessary.  PMHx: She  has a past medical history of Breast cancer ([place]) (2011), Chronic kidney disease, Colitis, Diverticulitis, Hyperchloremia, Hypertension, Hypothyroidism, and Pre-diabetes. Also,  has a past surgical history that includes Colonoscopy; Breast lumpectomy (Left, 02/2009); Breast surgery (Right, 02/2009); Appendectomy (1950); and Bunionectomy (Left, 03/25/2018)., family history includes Breast cancer in her cousin; Cancer in her paternal uncle; Migraines in her maternal grandmother; Pancreatic cancer in her father; Parkinson's disease in her mother.,  reports that she has never smoked. She has never used smokeless tobacco. She reports that she does not drink alcohol and does not use drugs.  She has a current medication list which includes the following prescription(s): anastrozole, aspirin ec, cetirizine, cholecalciferol, cinnamon, duloxetine, levothyroxine, lisinopril, pantoprazole, simvastatin, and trimethoprim. Also, is allergic to cephalexin, ciprofloxacin, and metronidazole.  Review of Systems  All other systems reviewed and are negative.  Objective: BP 120/80   Ht 5\' 2"  (1.575 m)   Wt 151 lb (68.5 kg)   BMI 27.62 kg/m  Physical Exam Constitutional:      General: She is not in acute distress.    Appearance: She is well-developed.  Genitourinary:     Right Labia: No rash or tenderness.    Left Labia: No tenderness or rash.    No vaginal erythema or bleeding.     Anterior and posterior vaginal prolapse present.    Mild vaginal atrophy present.     Right Adnexa: not tender and no mass present.     Left Adnexa: not tender and no mass present.    No cervical motion tenderness, discharge, polyp or nabothian cyst.     Uterus is prolapsed.     Uterus is not enlarged.     No uterine mass detected.    Uterus exam comments: Gr 3 prolapse.     Uterus is midaxial.     Pelvic exam was performed with patient in the lithotomy position.  HENT:     Head: Normocephalic and atraumatic.     Nose: Nose normal.  Abdominal:     General: There is no distension.     Palpations: Abdomen is soft.     Tenderness: There is no abdominal tenderness.  Musculoskeletal:        General: Normal range of motion.  Neurological:     Mental Status: She is alert and oriented to person, place, and time.     Cranial Nerves: No cranial nerve deficit.  Skin:    General: Skin is warm and dry.  Psychiatric:        Attention and Perception: Attention normal.        Mood and Affect: Mood and affect normal.        Speech: Speech normal.        Behavior: Behavior normal.        Thought Content: Thought content normal.        Judgment: Judgment normal.    Pessary Care Pessary removed and cleaned.  Vagina checked - without erosions - pessary replaced.  A/P:1. Cystocele with uterine descensus Pessary was cleaned and replaced today. Instructions given for care.  Concerning symptoms to observe for are counseled to patient. Follow up scheduled for 3 months.  A total of 20 minutes were spent face-to-face with the patient as well as preparation, review, communication, and documentation during this encounter.   Barnett Applebaum, MD, [facility], [facility] 07/18/2020  1:46 PM

## 2020-09-23 ENCOUNTER — Ambulatory Visit
Admission: RE | Admit: 2020-09-23 | Discharge: 2020-09-23 | Disposition: A | Discharge status: Home / Self Care | Payer: Medicare HMO | Source: Ambulatory Visit | Attending: Otolaryngology | Admitting: Otolaryngology

## 2020-09-23 ENCOUNTER — Other Ambulatory Visit: Payer: Self-pay | Admitting: Otolaryngology

## 2020-09-23 ENCOUNTER — Ambulatory Visit
Admission: RE | Admit: 2020-09-23 | Discharge: 2020-09-23 | Disposition: A | Discharge status: Home / Self Care | Payer: Medicare HMO | Attending: Otolaryngology | Admitting: Otolaryngology

## 2020-09-23 DIAGNOSIS — R053 Chronic cough: Secondary | ICD-10-CM | POA: Diagnosis not present

## 2020-09-23 DIAGNOSIS — R079 Chest pain, unspecified: Secondary | ICD-10-CM | POA: Diagnosis present

## 2020-10-04 DIAGNOSIS — J439 Emphysema, unspecified: Secondary | ICD-10-CM | POA: Insufficient documentation

## 2020-10-10 ENCOUNTER — Other Ambulatory Visit
Admission: RE | Admit: 2020-10-10 | Discharge: 2020-10-10 | Disposition: A | Discharge status: Home / Self Care | Payer: Medicare HMO | Source: Ambulatory Visit | Attending: Pulmonary Disease | Admitting: Pulmonary Disease

## 2020-10-10 DIAGNOSIS — J438 Other emphysema: Secondary | ICD-10-CM | POA: Insufficient documentation

## 2020-10-10 LAB — D-DIMER, QUANTITATIVE: D-Dimer, Quant: 2.07 ug/mL-FEU — ABNORMAL HIGH (ref 0.00–0.50)

## 2020-10-11 ENCOUNTER — Other Ambulatory Visit: Payer: Self-pay | Admitting: Pulmonary Disease

## 2020-10-11 ENCOUNTER — Other Ambulatory Visit: Payer: Self-pay

## 2020-10-11 ENCOUNTER — Ambulatory Visit
Admission: RE | Admit: 2020-10-11 | Discharge: 2020-10-11 | Disposition: A | Discharge status: Home / Self Care | Payer: Medicare HMO | Source: Ambulatory Visit | Attending: Pulmonary Disease | Admitting: Pulmonary Disease

## 2020-10-11 DIAGNOSIS — R7989 Other specified abnormal findings of blood chemistry: Secondary | ICD-10-CM | POA: Insufficient documentation

## 2020-10-11 MED ORDER — IOHEXOL 350 MG/ML SOLN
75.0000 mL | Freq: Once | INTRAVENOUS | Status: AC | PRN
  Administered 2020-10-11: 75 mL via INTRAVENOUS

## 2020-10-21 ENCOUNTER — Ambulatory Visit: Payer: Medicare HMO | Admitting: Obstetrics & Gynecology

## 2020-10-21 ENCOUNTER — Encounter: Payer: Self-pay | Admitting: Obstetrics & Gynecology

## 2020-10-21 ENCOUNTER — Other Ambulatory Visit: Payer: Self-pay

## 2020-10-21 VITALS — BP 120/80 | Ht 62.0 in | Wt 139.0 lb

## 2020-10-21 DIAGNOSIS — N814 Uterovaginal prolapse, unspecified: Secondary | ICD-10-CM

## 2020-10-21 DIAGNOSIS — R32 Unspecified urinary incontinence: Secondary | ICD-10-CM

## 2020-10-21 NOTE — Progress Notes (Signed)
HPI:      [name] is a 80 y.o. [phone] who presents today for her pessary follow up and examination related to her pelvic floor weakening.  Pt reports tolerating the pessary well with no vaginal bleeding and no vaginal discharge.  Symptoms of pelvic floor weakening have greatly improved. She is voiding and defecating without difficulty. She currently has a Incontinence Ring size #6 pessary.  PMHx: She  has a past medical history of Breast cancer ([place]) (2011), Chronic kidney disease, Colitis, Diverticulitis, Hyperchloremia, Hypertension, Hypothyroidism, and Pre-diabetes. Also,  has a past surgical history that includes Colonoscopy; Breast lumpectomy (Left, 02/2009); Breast surgery (Right, 02/2009); Appendectomy (1950); and Bunionectomy (Left, 03/25/2018)., family history includes Breast cancer in her cousin; Cancer in her paternal uncle; Migraines in her maternal grandmother; Pancreatic cancer in her father; Parkinson's disease in her mother.,  reports that she has never smoked. She has never used smokeless tobacco. She reports that she does not drink alcohol and does not use drugs.  She has a current medication list which includes the following prescription(s): anastrozole, aspirin ec, cetirizine, cholecalciferol, cinnamon, duloxetine, levothyroxine, lisinopril, pantoprazole, simvastatin, and trimethoprim. Also, is allergic to cephalexin, ciprofloxacin, and metronidazole.  Review of Systems  All other systems reviewed and are negative.  Objective: BP 120/80   Ht 5\' 2"  (1.575 m)   Wt 139 lb (63 kg)   BMI 25.42 kg/m  Physical Exam Constitutional:      General: She is not in acute distress.    Appearance: She is well-developed.  Genitourinary:     Right Labia: No rash or tenderness.    Left Labia: No tenderness or rash.    No vaginal erythema or bleeding.     Anterior vaginal prolapse present.    Mild vaginal atrophy present.     Right Adnexa: not tender and no mass present.     Left Adnexa: not tender and no mass present.    No cervical motion tenderness, discharge, polyp or nabothian cyst.     Uterus is prolapsed.     Uterus is not enlarged.     No uterine mass detected.    Uterus exam comments: Gr 2 prolapse.     Bladder exam comments: Cystocele gr 3.     Pelvic exam was performed with patient in the lithotomy position.  HENT:     Head: Normocephalic and atraumatic.     Nose: Nose normal.  Abdominal:     General: There is no distension.     Palpations: Abdomen is soft.     Tenderness: There is no abdominal tenderness.  Musculoskeletal:        General: Normal range of motion.  Neurological:     Mental Status: She is alert and oriented to person, place, and time.     Cranial Nerves: No cranial nerve deficit.  Skin:    General: Skin is warm and dry.  Psychiatric:        Attention and Perception: Attention normal.        Mood and Affect: Mood and affect normal.        Speech: Speech normal.        Behavior: Behavior normal.        Thought Content: Thought content normal.        Judgment: Judgment normal.    Pessary Care Pessary removed and cleaned.  Vagina checked - without erosions - pessary replaced.  A/P:   ICD-10-CM   1. Cystocele with uterine descensus  N81.4  2. Incontinence of urine in female  R32     Pessary was cleaned and replaced today. Instructions given for care. Concerning symptoms to observe for are counseled to patient. Follow up scheduled for 3 months.  A total of 20 minutes were spent face-to-face with the patient as well as preparation, review, communication, and documentation during this encounter.   Barnett Applebaum, MD, [facility], [facility] 10/21/2020  2:02 PM

## 2020-12-31 DIAGNOSIS — G35 Multiple sclerosis: Secondary | ICD-10-CM | POA: Diagnosis present

## 2021-02-06 ENCOUNTER — Other Ambulatory Visit: Payer: Self-pay | Admitting: Pulmonary Disease

## 2021-02-06 DIAGNOSIS — R918 Other nonspecific abnormal finding of lung field: Secondary | ICD-10-CM

## 2021-02-10 ENCOUNTER — Other Ambulatory Visit: Payer: Self-pay

## 2021-02-10 ENCOUNTER — Ambulatory Visit: Payer: Medicare HMO | Admitting: Dermatology

## 2021-02-10 DIAGNOSIS — L409 Psoriasis, unspecified: Secondary | ICD-10-CM | POA: Diagnosis not present

## 2021-02-10 MED ORDER — TRIAMCINOLONE ACETONIDE 0.1 % EX CREA: TOPICAL_CREAM | CUTANEOUS | 1 refills | Status: DC

## 2021-02-10 NOTE — Progress Notes (Signed)
° °  New Patient Visit  Subjective  Christina Cuevas is a 81 y.o. female who presents for the following: Rash (Pt c/o new rash on her hands started about 6 weeks ago dry and itchy, treating with otc creams with a poor response ).  The following portions of the chart were reviewed this encounter and updated as appropriate:   Tobacco   Allergies   Meds   Problems   Med Hx   Surg Hx   Fam Hx      Review of Systems:  No other skin or systemic complaints except as noted in HPI or Assessment and Plan.  Objective  Well appearing patient in no apparent distress; mood and affect are within normal limits.  A focused examination was performed including face,hands, elbows, knees. Relevant physical exam findings are noted in the Assessment and Plan.  palms,elbows Well-demarcated erythematous papules/plaques with silvery scale, guttate pink scaly papules at palms and elbows  Clear at knees     Assessment & Plan  Psoriasis palms,elbows  Psoriasis is a chronic non-curable, but treatable genetic/hereditary disease that may have other systemic features affecting other organ systems such as joints (Psoriatic Arthritis). It is associated with an increased risk of inflammatory bowel disease, heart disease, non-alcoholic fatty liver disease, and depression.     Start triamcinolone 0.1% cream apply to affected skin apply twice daily as needed to affected areas for two weeks then apply to affected skin 5 days a week prn.  Topical steroids (such as triamcinolone, fluocinolone, fluocinonide, mometasone, clobetasol, halobetasol, betamethasone, hydrocortisone) can cause thinning and lightening of the skin if they are used for too long in the same area. Your physician has selected the right strength medicine for your problem and area affected on the body. Please use your medication only as directed by your physician to prevent side effects.    Related Medications triamcinolone cream (KENALOG) 0.1 % apply twice daily  as needed to affected areas for two weeks then decrease to  5 days a week   Return in about 6 weeks (around 03/24/2021) for Psoriasis.  IMarye Round, CMA, am acting as scribe for Sarina Ser, MD .  Documentation: I have reviewed the above documentation for accuracy and completeness, and I agree with the above.  Sarina Ser, MD

## 2021-02-10 NOTE — Patient Instructions (Signed)

## 2021-02-11 ENCOUNTER — Encounter: Payer: Self-pay | Admitting: Dermatology

## 2021-03-24 ENCOUNTER — Ambulatory Visit: Payer: Medicare HMO | Admitting: Dermatology

## 2021-04-02 ENCOUNTER — Telehealth: Payer: Self-pay

## 2021-04-02 ENCOUNTER — Other Ambulatory Visit: Payer: Self-pay

## 2021-04-02 ENCOUNTER — Encounter: Payer: Medicare HMO | Admitting: Dermatology

## 2021-04-02 NOTE — Progress Notes (Signed)
This encounter was created in error - please disregard.

## 2021-04-02 NOTE — Patient Instructions (Signed)

## 2021-04-02 NOTE — Telephone Encounter (Signed)
Patient left without being seen today due to wait time. Patient was upset with the office. ? ?Christina Cuevas has voided copay for today so nothing will be remaining or left for a "credit". ?

## 2021-04-02 NOTE — Progress Notes (Incomplete)
? ?  Follow-Up Visit ?  ?Subjective  ?Christina Cuevas is a 81 y.o. female who presents for the following: No chief complaint on file.. ? ?*** ? ?The following portions of the chart were reviewed this encounter and updated as appropriate:  ?  ?  ? ?Review of Systems:  No other skin or systemic complaints except as noted in HPI or Assessment and Plan. ? ?Objective  ?Well appearing patient in no apparent distress; mood and affect are within normal limits. ? ?{Exam:34163::"A full examination was performed including scalp, head, eyes, ears, nose, lips, neck, chest, axillae, abdomen, back, buttocks, bilateral upper extremities, bilateral lower extremities, hands, feet, fingers, toes, fingernails, and toenails. All findings within normal limits unless otherwise noted below."} ? ? ? ?Assessment & Plan  ? ?No follow-ups on file. ? ? ?

## 2021-04-23 ENCOUNTER — Ambulatory Visit: Payer: Medicare HMO | Admitting: Dermatology

## 2022-05-18 DIAGNOSIS — N1831 Chronic kidney disease, stage 3a: Secondary | ICD-10-CM | POA: Diagnosis present

## 2022-05-23 ENCOUNTER — Emergency Department
Admission: EM | Admit: 2022-05-23 | Discharge: 2022-05-23 | Disposition: A | Discharge status: Home / Self Care | Payer: Medicare HMO | Attending: Emergency Medicine | Admitting: Emergency Medicine

## 2022-05-23 ENCOUNTER — Other Ambulatory Visit: Payer: Self-pay

## 2022-05-23 ENCOUNTER — Emergency Department: Payer: Medicare HMO

## 2022-05-23 DIAGNOSIS — S0181XA Laceration without foreign body of other part of head, initial encounter: Secondary | ICD-10-CM | POA: Insufficient documentation

## 2022-05-23 DIAGNOSIS — S0993XA Unspecified injury of face, initial encounter: Secondary | ICD-10-CM | POA: Diagnosis present

## 2022-05-23 DIAGNOSIS — R519 Headache, unspecified: Secondary | ICD-10-CM | POA: Insufficient documentation

## 2022-05-23 DIAGNOSIS — W19XXXA Unspecified fall, initial encounter: Secondary | ICD-10-CM | POA: Insufficient documentation

## 2022-05-23 MED ORDER — DOXYCYCLINE MONOHYDRATE 100 MG PO TABS: 100.0000 mg | ORAL_TABLET | Freq: Two times a day (BID) | ORAL | 0 refills | Status: AC

## 2022-05-23 MED ORDER — LIDOCAINE HCL 1 % IJ SOLN
5.0000 mL | Freq: Once | INTRAMUSCULAR | Status: AC
  Administered 2022-05-23: 5 mL
  Filled 2022-05-23: qty 10

## 2022-05-23 MED ORDER — LIDOCAINE HCL (PF) 1 % IJ SOLN
INTRAMUSCULAR | Status: AC
  Filled 2022-05-23: qty 5

## 2022-05-23 NOTE — Discharge Instructions (Signed)
Have sutures removed in 5 days. Take doxycycline twice daily for the next 7 days. Keep wound clean and dry for the next 24 hours. Apply a thin layer of Vaseline to wound once daily. After sutures are removed, apply an SPF 40. You can apply Mederma or vitamin E ointment to help with scarring.

## 2022-05-23 NOTE — ED Provider Triage Note (Signed)
Emergency Medicine Provider Triage Evaluation Note  Christina Cuevas , a 82 y.o. female  was evaluated in triage.  Pt complains of dizziness and laceration after mechanical fall prior to arrival. She tripped over a concrete step at her pool and fell forward. Her sunglasses must have broken the fall. She now has a laceration over the left eyebrow and abrasion to right knee. No loss of consciousness.  Physical Exam  There were no vitals taken for this visit. Gen:   Awake, no distress   Resp:  Normal effort  MSK:   Moves extremities without difficulty  Other:  Stellate laceration over left eyebrow. Bleeding controlled.  Medical Decision Making  Medically screening exam initiated at 4:45 PM.  Appropriate orders placed.  Christina Cuevas was informed that the remainder of the evaluation will be completed by another provider, this initial triage assessment does not replace that evaluation, and the importance of remaining in the ED until their evaluation is complete.  CT head, cervical spine, and max face ordered.   Chinita Pester, FNP 05/23/22 [id number]

## 2022-05-23 NOTE — ED Triage Notes (Signed)
Pt had mechanical fall on concrete. Pt hit right knee- abrasion noted with Band-Aid. Laceration and swelling noted over left eye. Pt is AOX4, nad noted, bleeding is controlled without pressure at this time. Pt denies blood thinners, LOC, vision changes.

## 2022-05-23 NOTE — ED Provider Notes (Signed)
[facility] Provider Note  Patient Contact: 7:51 PM (approximate)   History   Fall   HPI  Christina Cuevas is a 82 y.o. female presents to the emergency department with a 2 cm left-sided forehead laceration after patient had a mechanical fall.  Patient lost her balance and fell against concrete.  No loss of consciousness occurred.  Patient is primarily complaining of headache and facial pain.  No blood thinner usage.  No chest pain, chest tightness or abdominal pain.      Physical Exam   Triage Vital Signs: ED Triage Vitals  Enc Vitals Group     BP 05/23/22 1646 (!) 140/128     Pulse Rate 05/23/22 1646 66     Resp 05/23/22 1646 16     Temp 05/23/22 1646 98.2 F (36.8 C)     Temp Source 05/23/22 1646 Oral     SpO2 05/23/22 1646 100 %     Weight --      Height --      Head Circumference --      Peak Flow --      Pain Score 05/23/22 1647 7     Pain Loc --      Pain Edu? --      Excl. in GC? --     Most recent vital signs: Vitals:   05/23/22 1646  BP: (!) 140/128  Pulse: 66  Resp: 16  Temp: 98.2 F (36.8 C)  SpO2: 100%     General: Alert and in no acute distress. Eyes:  PERRL. EOMI. Head: No acute traumatic findings ENT:      Nose: No congestion/rhinnorhea.      Mouth/Throat: Mucous membranes are moist.  Neck: No stridor. No cervical spine tenderness to palpation. Cardiovascular:  Good peripheral perfusion Respiratory: Normal respiratory effort without tachypnea or retractions. Lungs CTAB. Good air entry to the bases with no decreased or absent breath sounds. Gastrointestinal: Bowel sounds 4 quadrants. Soft and nontender to palpation. No guarding or rigidity. No palpable masses. No distention. No CVA tenderness. Musculoskeletal: Full range of motion to all extremities.  Neurologic:  No gross focal neurologic deficits are appreciated.  Skin: Patient has a 2 cm laceration above left eyebrow.   ED Results / Procedures / Treatments    Labs (all labs ordered are listed, but only abnormal results are displayed) Labs Reviewed - No data to display      RADIOLOGY  I personally viewed and evaluated these images as part of my medical decision making, as well as reviewing the written report by the radiologist.  ED Provider Interpretation: CT of the head, face and cervical spine show no evidence of intracranial bleed, skull fracture or C-spine fracture.   PROCEDURES:  Critical Care performed: No  ..Laceration Repair  Date/Time: 05/23/2022 7:53 PM  Performed by: Orvil Feil, PA-C Authorized by: Orvil Feil, PA-C   Consent:    Consent obtained:  Verbal   Risks discussed:  Infection and pain Universal protocol:    Procedure explained and questions answered to patient or proxy's satisfaction: yes     Patient identity confirmed:  Verbally with patient Anesthesia:    Anesthesia method:  Local infiltration   Local anesthetic:  Lidocaine 1% w/o epi Laceration details:    Location:  Face   Face location:  Forehead   Length (cm):  2   Depth (mm):  5 Pre-procedure details:    Preparation:  Patient was prepped and draped in usual  sterile fashion Exploration:    Limited defect created (wound extended): no   Treatment:    Area cleansed with:  Povidone-iodine   Amount of cleaning:  Standard   Irrigation solution:  Sterile saline   Debridement:  None Skin repair:    Repair method:  Sutures   Suture size:  5-0   Suture technique:  Simple interrupted   Number of sutures:  2 Approximation:    Approximation:  Close Repair type:    Repair type:  Simple Post-procedure details:    Dressing:  Non-adherent dressing    MEDICATIONS ORDERED IN ED: Medications  lidocaine (XYLOCAINE) 1 % (with pres) injection 5 mL (5 mLs Infiltration Given by Other 05/23/22 1940)  lidocaine (PF) (XYLOCAINE) 1 % injection (  Given by Other 05/23/22 1945)     IMPRESSION / MDM / ASSESSMENT AND PLAN / ED COURSE  I reviewed the  triage vital signs and the nursing notes.                              Assessment and plan Fall Laceration 82 year old female presents to the emergency department after she had a mechanical fall.  Patient was hypertensive at triage but vital signs were otherwise reassuring.  On exam, patient was alert and nontoxic-appearing with no neurodeficits noted.  CTs of the head, face and cervical spine showed no evidence of intracranial bleed, skull fracture or C-spine fracture.  Laceration repair occurred without complication and patient's husband stated that patient was up-to-date on tetanus status.  Patient was discharged with doxycycline as she is allergic to penicillin.  Return precautions were given to return with new or worsening symptoms.     FINAL CLINICAL IMPRESSION(S) / ED DIAGNOSES   Final diagnoses:  Fall, initial encounter  Facial laceration, initial encounter     Rx / DC Orders   ED Discharge Orders          Ordered    doxycycline (ADOXA) 100 MG tablet  2 times daily        05/23/22 1949             Note:  This document was prepared using Dragon voice recognition software and may include unintentional dictation errors.   Pia Mau [place], PA-C 05/23/22 1954    Georga Hacking, MD 05/24/22 1310

## 2022-05-29 ENCOUNTER — Encounter: Payer: Self-pay | Admitting: Emergency Medicine

## 2022-05-29 ENCOUNTER — Ambulatory Visit
Admission: EM | Admit: 2022-05-29 | Discharge: 2022-05-29 | Disposition: A | Discharge status: Home / Self Care | Payer: Medicare HMO

## 2022-05-29 DIAGNOSIS — S0181XD Laceration without foreign body of other part of head, subsequent encounter: Secondary | ICD-10-CM | POA: Diagnosis not present

## 2022-05-29 DIAGNOSIS — Z4802 Encounter for removal of sutures: Secondary | ICD-10-CM

## 2022-05-29 NOTE — Discharge Instructions (Signed)
Follow-up with your primary care provider as needed.

## 2022-05-29 NOTE — ED Provider Notes (Signed)
Christina Cuevas    CSN: [id number] Arrival date & time: 05/29/22  1639      History   Chief Complaint Chief Complaint  Patient presents with   Suture / Staple Removal    HPI Christina Cuevas is a 82 y.o. female.  Patient presents for removal of sutures from her left eyebrow.  She was seen at [facility] ED on 05/23/2022 for a facial laceration due to a fall.  She reports the wound has been healing well.  No fever, chills, redness, drainage, or other symptoms.  The history is provided by the patient and medical records.    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 Slight to moderate atypia   Dysplastic nevus 01/18/2007   Right sup lat thigh. Moderate atypia, extends to one edge.   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    Past Surgical History:  Procedure Laterality Date   APPENDECTOMY  1950   BREAST LUMPECTOMY Left 02/2009   lymph nodes removed   BREAST SURGERY Right 02/2009   sclerosing lesion removed at same time   BUNIONECTOMY Left 03/25/2018   Procedure: DOUBLE OSTEOTOMY;  Surgeon: Gwyneth Revels, DPM;  Location: [facility] ORS;  Service: Podiatry;  Laterality: Left;   COLONOSCOPY      OB History     Gravida  3   Para  3   Term  3   Preterm      AB      Living  3      SAB      IAB      Ectopic      Multiple      Live Births               Home  Medications    Prior to Admission medications   Medication Sig Start Date End Date Taking? Authorizing Provider  albuterol (VENTOLIN HFA) 108 (90 Base) MCG/ACT inhaler Inhale into the lungs. 02/03/21  Yes [provider]  anastrozole (ARIMIDEX) 1 MG tablet Take 1 tablet by mouth daily. Patient not taking: Reported on 04/02/2021 03/05/14   [provider]  aspirin EC 81 MG tablet Take 1 tablet by mouth daily. Patient not taking: Reported on 04/02/2021    [provider]  cetirizine (ZYRTEC) 10 MG tablet Take 10 mg by mouth daily.     [provider]  Cholecalciferol 25 MCG (1000 UT) tablet Take 4,000 Units by mouth 2 (two) times daily. 2000 mg in morning and again at night    [provider]  Cinnamon 500 MG capsule Take 1,000 mg by mouth 2 (two) times daily.  Patient not taking: Reported on 04/02/2021    [provider]  doxycycline (ADOXA) 100 MG tablet Take 1 tablet (100 mg total) by mouth 2 (two) times  daily for 7 days. 05/23/22 05/30/22  Orvil Feil, PA-C  DULoxetine (CYMBALTA) 30 MG capsule Take 30 mg by mouth daily.  06/18/17   [provider]  levothyroxine (SYNTHROID, LEVOTHROID) 88 MCG tablet Take 88 mcg by mouth daily before breakfast.  07/20/14   [provider]  lisinopril (PRINIVIL,ZESTRIL) 10 MG tablet Take 5 mg by mouth daily.  Patient not taking: Reported on 04/02/2021 09/24/14   [provider]  pantoprazole (PROTONIX) 40 MG tablet Take 40 mg by mouth daily.  09/24/14   [provider]  simvastatin (ZOCOR) 10 MG tablet Take 5 mg by mouth every evening.  11/01/14   [provider]  triamcinolone cream (KENALOG) 0.1 % apply twice daily as needed to affected areas for two weeks then decrease to  5 days a week 02/10/21   Deirdre Evener, MD  trimethoprim (TRIMPEX) 100 MG tablet Take 100 mg by mouth at bedtime. Patient not taking: Reported on 04/02/2021 02/21/18   [provider]     Family History Family History  Problem Relation Age of Onset   Parkinson's disease Mother    Pancreatic cancer Father    Cancer Paternal Uncle        2 uncles    Migraines Maternal Grandmother    Breast cancer Cousin     Social History Social History   Tobacco Use   Smoking status: Never   Smokeless tobacco: Never  Vaping Use   Vaping Use: Never used  Substance Use Topics   Alcohol use: No   Drug use: No     Allergies   Floxacillin (flucloxacillin), Cephalexin, Ciprofloxacin, and Metronidazole   Review of Systems Review of Systems  Constitutional:  Negative for chills and fever.  Skin:  Positive for wound. Negative for color change.     Physical Exam Triage Vital Signs ED Triage Vitals  Enc Vitals Group     BP 05/29/22 1659 116/75     Pulse Rate 05/29/22 1659 64     Resp 05/29/22 1659 16     Temp 05/29/22 1659 98.2 F (36.8 C)     Temp src --      SpO2 05/29/22 1659 94 %     Weight 05/29/22 1655 142 lb (64.4 kg)     Height 05/29/22 1655 5\' 2"  (1.575 m)     Head Circumference --      Peak Flow --      Pain Score --      Pain Loc --      Pain Edu? --      Excl. in GC? --    No data found.  Updated Vital Signs BP 116/75   Pulse 64   Temp 98.2 F (36.8 C)   Resp 16   Ht 5\' 2"  (1.575 m)   Wt 142 lb (64.4 kg)   SpO2 94%   BMI 25.97 kg/m   Visual Acuity Right Eye Distance:   Left Eye Distance:   Bilateral Distance:    Right Eye Near:   Left Eye Near:    Bilateral Near:     Physical Exam Constitutional:      General: She is not in acute distress.    Appearance: Normal appearance. She is not ill-appearing.  HENT:     Mouth/Throat:     Mouth: Mucous membranes are moist.  Cardiovascular:     Rate and Rhythm: Normal rate and regular rhythm.  Pulmonary:     Effort: Pulmonary effort is normal.  No respiratory distress.  Skin:    General: Skin is warm and dry.     Findings: Lesion present. No erythema.     Comments: Laceration on left  eyebrow that appears to be healing well.  No erythema or drainage.  Neurological:     Mental Status: She is alert.  Psychiatric:        Mood and Affect: Mood normal.        Behavior: Behavior normal.      UC Treatments / Results  Labs (all labs ordered are listed, but only abnormal results are displayed) Labs Reviewed - No data to display  EKG   Radiology No results found.  Procedures Procedures (including critical care time)  Medications Ordered in UC Medications - No data to display  Initial Impression / Assessment and Plan / UC Course  I have reviewed the triage vital signs and the nursing notes.  Pertinent labs & imaging results that were available during my care of the patient were reviewed by me and considered in my medical decision making (see chart for details).   Encounter for suture removal, facial laceration.  3 sutures removed.  The wound appears to be healing well.  Education provided on wound care after suture removal.  Instructed patient to follow-up with her PCP as needed.  She agrees to plan of care.   Final Clinical Impressions(s) / UC Diagnoses   Final diagnoses:  Encounter for removal of sutures  Facial laceration, subsequent encounter     Discharge Instructions      Follow up with your primary care provider as needed.        ED Prescriptions   None    PDMP not reviewed this encounter.   Mickie Bail, NP 05/29/22 [phone]

## 2022-05-29 NOTE — ED Triage Notes (Addendum)
Patient in office today for suture removal. Sutures were placed at [facility] above left eye lid. No complaints OTC: none

## 2022-10-18 ENCOUNTER — Other Ambulatory Visit: Payer: Self-pay

## 2022-10-18 ENCOUNTER — Emergency Department: Payer: Medicare HMO

## 2022-10-18 ENCOUNTER — Encounter: Payer: Self-pay | Admitting: Emergency Medicine

## 2022-10-18 ENCOUNTER — Emergency Department
Admission: EM | Admit: 2022-10-18 | Discharge: 2022-10-18 | Disposition: A | Discharge status: Home / Self Care | Payer: Medicare HMO | Attending: Emergency Medicine | Admitting: Emergency Medicine

## 2022-10-18 DIAGNOSIS — N200 Calculus of kidney: Secondary | ICD-10-CM | POA: Insufficient documentation

## 2022-10-18 DIAGNOSIS — I10 Essential (primary) hypertension: Secondary | ICD-10-CM | POA: Diagnosis not present

## 2022-10-18 DIAGNOSIS — K5730 Diverticulosis of large intestine without perforation or abscess without bleeding: Secondary | ICD-10-CM | POA: Insufficient documentation

## 2022-10-18 DIAGNOSIS — R110 Nausea: Secondary | ICD-10-CM | POA: Diagnosis present

## 2022-10-18 DIAGNOSIS — E039 Hypothyroidism, unspecified: Secondary | ICD-10-CM | POA: Insufficient documentation

## 2022-10-18 DIAGNOSIS — R911 Solitary pulmonary nodule: Secondary | ICD-10-CM | POA: Insufficient documentation

## 2022-10-18 DIAGNOSIS — R197 Diarrhea, unspecified: Secondary | ICD-10-CM | POA: Diagnosis not present

## 2022-10-18 DIAGNOSIS — Z853 Personal history of malignant neoplasm of breast: Secondary | ICD-10-CM | POA: Diagnosis not present

## 2022-10-18 DIAGNOSIS — I700 Atherosclerosis of aorta: Secondary | ICD-10-CM | POA: Insufficient documentation

## 2022-10-18 DIAGNOSIS — E119 Type 2 diabetes mellitus without complications: Secondary | ICD-10-CM | POA: Insufficient documentation

## 2022-10-18 DIAGNOSIS — R5383 Other fatigue: Secondary | ICD-10-CM | POA: Diagnosis not present

## 2022-10-18 LAB — BASIC METABOLIC PANEL
Anion gap: 12 (ref 5–15)
BUN: 14 mg/dL (ref 8–23)
CO2: 21 mmol/L — ABNORMAL LOW (ref 22–32)
Calcium: 9.2 mg/dL (ref 8.9–10.3)
Chloride: 103 mmol/L (ref 98–111)
Creatinine, Ser: 0.83 mg/dL (ref 0.44–1.00)
GFR, Estimated: >60 mL/min (ref >60)
Glucose, Bld: 139 mg/dL — ABNORMAL HIGH (ref 70–99)
Potassium: 3.9 mmol/L (ref 3.5–5.1)
Sodium: 136 mmol/L (ref 135–145)

## 2022-10-18 LAB — HEPATIC FUNCTION PANEL
ALT: 32 U/L (ref 0–44)
AST: 29 U/L (ref 15–41)
Albumin: 4.1 g/dL (ref 3.5–5.0)
Alkaline Phosphatase: 73 U/L (ref 38–126)
Bilirubin, Direct: 0.1 mg/dL (ref 0.0–0.2)
Indirect Bilirubin: 0.8 mg/dL (ref 0.3–0.9)
Total Bilirubin: 0.9 mg/dL (ref 0.3–1.2)
Total Protein: 8.7 g/dL — ABNORMAL HIGH (ref 6.5–8.1)

## 2022-10-18 LAB — CBC
HCT: 39.9 % (ref 36.0–46.0)
Hemoglobin: 13 g/dL (ref 12.0–15.0)
MCH: 30.2 pg (ref 26.0–34.0)
MCHC: 32.6 g/dL (ref 30.0–36.0)
MCV: 92.6 fL (ref 80.0–100.0)
Platelets: 219 10*3/uL (ref 150–400)
RBC: 4.31 MIL/uL (ref 3.87–5.11)
RDW: 13.2 % (ref 11.5–15.5)
WBC: 8.5 10*3/uL (ref 4.0–10.5)
nRBC: 0 % (ref 0.0–0.2)

## 2022-10-18 LAB — LIPASE, BLOOD: Lipase: 26 U/L (ref 11–51)

## 2022-10-18 LAB — TROPONIN I (HIGH SENSITIVITY): Troponin I (High Sensitivity): <2 ng/L (ref <18)

## 2022-10-18 MED ORDER — FAMOTIDINE 20 MG PO TABS
20.0000 mg | ORAL_TABLET | Freq: Once | ORAL | Status: AC
  Administered 2022-10-18: 20 mg via ORAL
  Filled 2022-10-18: qty 1

## 2022-10-18 MED ORDER — ALUM & MAG HYDROXIDE-SIMETH 200-200-20 MG/5ML PO SUSP
15.0000 mL | Freq: Once | ORAL | Status: AC
  Administered 2022-10-18: 15 mL via ORAL
  Filled 2022-10-18: qty 30

## 2022-10-18 MED ORDER — ONDANSETRON HCL 4 MG/2ML IJ SOLN
4.0000 mg | INTRAMUSCULAR | Status: AC
  Administered 2022-10-18: 4 mg via INTRAVENOUS
  Filled 2022-10-18: qty 2

## 2022-10-18 MED ORDER — IOHEXOL 300 MG/ML  SOLN
100.0000 mL | Freq: Once | INTRAMUSCULAR | Status: AC | PRN
  Administered 2022-10-18: 75 mL via INTRAVENOUS

## 2022-10-18 MED ORDER — ONDANSETRON 4 MG PO TBDP: 4.0000 mg | ORAL_TABLET | Freq: Four times a day (QID) | ORAL | 0 refills | Status: AC | PRN

## 2022-10-18 NOTE — ED Provider Notes (Signed)
[facility] Provider Note    Event Date/Time   First MD Initiated Contact with Patient 10/18/22 1504     (approximate)   History   Emesis and Heartburn   HPI  Christina Cuevas is a 82 y.o. female been experiencing about 2 days of burning discomfort in her upper abdomen lower chest which she identifies as "acid reflux"  She had 1 loose stool that seem sort of like diarrhea nonbloody yesterday.  Nausea.  No fevers or chills.  She has had 2 cups of coffee and drink water today, but reports decreased appetite   Denies abdominal pain.  No chest pain or shortness of breath.  She also relates that some the symptoms started when she went to the pharmacy about 1 week ago and got 4 vaccines at 1 time, that made her feel fatigued for the last several days.  Has been experiencing a feeling of slight fatigue for about a week now   PMH: Allergic rhinitis  Anesthesia complication  Pt endorses slow to awaken with anesthesia years ago at OSH, no recent problems with anesthesia  Arthritis  Breast cancer (CMS/HHS-HCC) 2011  left  DM II (diabetes mellitus, type II), controlled (CMS/HHS-HCC)  diet controlled  Encounter for blood transfusion  no adverse reaction  GERD (gastroesophageal reflux disease)  Goiter  History of blood transfusion  History of diverticulitis  History of radiation therapy  Hyperlipidemia  Hypertension  Hypothyroidism  IBS (irritable bowel syndrome)  Melanoma (CMS/HHS-HCC)  x 6  Melanoma-pancreatic cancer syndrome (CMS/HHS-HCC)  Multiple sclerosis (CMS/HHS-HCC)  mild  Urinary incontinence  Wears glasses    Physical Exam   Triage Vital Signs: ED Triage Vitals  Encounter Vitals Group     BP 10/18/22 1439 (!) 155/80     Systolic BP Percentile --      Diastolic BP Percentile --      Pulse Rate 10/18/22 1439 65     Resp 10/18/22 1439 (!) 22     Temp 10/18/22 1439 97.7 F (36.5 C)     Temp Source 10/18/22 1439 Oral     SpO2 10/18/22 1439  100 %     Weight 10/18/22 1445 140 lb (63.5 kg)     Height 10/18/22 1445 5\' 1"  (1.549 m)     Head Circumference --      Peak Flow --      Pain Score 10/18/22 1445 7     Pain Loc --      Pain Education --      Exclude from Growth Chart --     Most recent vital signs: Vitals:   10/18/22 1630 10/18/22 1700  BP: (!) 170/97 (!) 145/72  Pulse: (!) 55 (!) 52  Resp: 17 14  Temp:    SpO2: 100% 97%     General: Awake, no distress.  Pleasant.  In no distress.  Reports ongoing mild nausea. CV:  Good peripheral perfusion.  Normal tones and rate Resp:  Normal effort.  Clear bilateral with normal breathing Abd:  No distention.  Soft nontender nondistended except mild discomfort in the suprapubic region.  Denies any pain or burning with urination.  She reports it does feel tender throughout the lower abdomen no rebound or guarding.  She reports mild nausea ongoing, slightly exacerbated by abdominal exam Other:  No CVA tenderness bilateral   ED Results / Procedures / Treatments   Labs (all labs ordered are listed, but only abnormal results are displayed) Labs Reviewed  BASIC METABOLIC  PANEL - Abnormal; Notable for the following components:      Result Value   CO2 21 (*)    Glucose, Bld 139 (*)    All other components within normal limits  HEPATIC FUNCTION PANEL - Abnormal; Notable for the following components:   Total Protein 8.7 (*)    All other components within normal limits  CBC  LIPASE, BLOOD  TROPONIN I (HIGH SENSITIVITY)     EKG  And inter by me at 1445 heart rate 60 QRS fairly narrow, QTc approximately 300  Artifact present in some leads.  Normal sinus rhythm, no obvious ischemia   RADIOLOGY  Chest x-ray interpreted by me as negative for acute finding    CT ABDOMEN PELVIS W CONTRAST  Result Date: 10/18/2022 CLINICAL DATA:  Bowel obstruction suspected. Patient presents from home with nausea. EXAM: CT ABDOMEN AND PELVIS WITH CONTRAST TECHNIQUE: Multidetector CT  imaging of the abdomen and pelvis was performed using the standard protocol following bolus administration of intravenous contrast. RADIATION DOSE REDUCTION: This exam was performed according to the departmental dose-optimization program which includes automated exposure control, adjustment of the mA and/or kV according to patient size and/or use of iterative reconstruction technique. CONTRAST:  75mL OMNIPAQUE IOHEXOL 300 MG/ML  SOLN COMPARISON:  07/08/2018 FINDINGS: Lower chest: No acute abnormality. Chronic interstitial changes with subpleural and parenchymal interstitial reticulation. Nodule within the lateral left lung base measures 7 mm, image 22/4. This is unchanged from 10/11/2020. Hepatobiliary: No focal liver abnormality is seen. No gallstones, gallbladder wall thickening, or biliary dilatation. Pancreas: No inflammation or mass identified. Measures 5 mm and is unchanged from 07/08/2018. Spleen: Normal in size without focal abnormality. Adrenals/Urinary Tract: Normal adrenal glands. Punctate stone noted within the upper pole of the right kidney, image 28/2. No kidney mass, or hydronephrosis identified. Urinary bladder is unremarkable. Stomach/Bowel: The stomach appears unremarkable. Status post appendectomy. No pathologic dilatation of the large or small bowel loops to suggest an obstruction. Colonic diverticulosis identified. No signs of acute diverticulitis. Vascular/Lymphatic: Aortic atherosclerosis. The upper abdominal vascularity is patent. No signs of abdominopelvic adenopathy. Reproductive: Pessary device is identified. Uterus and adnexal structures are unremarkable. Other: No free air.  There is no free fluid or fluid collections. Musculoskeletal: No acute or significant osseous findings. IMPRESSION: 1. No acute findings within the abdomen or pelvis. No signs of bowel obstruction. 2. Colonic diverticulosis without signs of acute diverticulitis. 3. Punctate nonobstructing right renal calculus. 4.  Chronic interstitial changes within the lung bases. 5. Nodule within the lateral left lung base measures 7 mm. This is unchanged when compared with 10/12/2022. Findings compatible with a benign nodule requiring no further follow-up. 6.  Aortic Atherosclerosis (ICD10-I70.0). Electronically Signed   By: Signa Kell M.D.   On: 10/18/2022 18:25   DG Chest 2 View  Result Date: 10/18/2022 CLINICAL DATA:  Nausea.  Heartburn.  Shortness of breath. EXAM: CHEST - 2 VIEW COMPARISON:  09/23/2020 FINDINGS: Patient rotated right. Midline trachea. Mild cardiomegaly. Atherosclerosis in the transverse aorta. Mild right hemidiaphragm elevation. No pleural effusion or pneumothorax. 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 No lobar consolidation. IMPRESSION: Chronic interstitial coarsening as detailed above. No superimposed acute process. Aortic Atherosclerosis (ICD10-I70.0). Electronically Signed   By: Jeronimo Greaves M.D.   On: 10/18/2022 16:34      PROCEDURES:  Critical Care performed: No  Procedures   MEDICATIONS  ORDERED IN ED: Medications  ondansetron (ZOFRAN) injection 4 mg (4 mg Intravenous Given 10/18/22 1631)  iohexol (OMNIPAQUE) 300 MG/ML solution 100 mL (75 mLs Intravenous Contrast Given 10/18/22 1618)  alum & mag hydroxide-simeth (MAALOX/MYLANTA) 200-200-20 MG/5ML suspension 15 mL (15 mLs Oral Given 10/18/22 1826)  famotidine (PEPCID) tablet 20 mg (20 mg Oral Given 10/18/22 1826)     IMPRESSION / MDM / ASSESSMENT AND PLAN / ED COURSE  I reviewed the triage vital signs and the nursing notes.                              Differential diagnosis includes, but is not limited to, probable intra-abdominal cause versus mild insidious illness.  She reports several days of symptoms starting about 7 days ago actually  with fatigue after receiving multiple vaccines.  She also does have mild tenderness in the lower abdomen with a history of diverticulitis and given her loose stool yesterday will obtain imaging including CT abdomen pelvis to evaluate for acute intra-abdominal cause including diverticulitis colitis gastritis etc.  Overall she is nontoxic well-appearing with reassuring examination though but persistent nausea  Patient's presentation is most consistent with acute complicated illness / injury requiring diagnostic workup.  ----------------------------------------- 7:04 PM on 10/18/2022 ----------------------------------------- Patient reports she feels much better.  She is awake alert well-appearing.  Reports nausea has abated.  Patient does not wish to take additional time here in the ER, does not wish to provide a urinalysis at this point but also advises she has had urinary infections in the past and has had no similar symptoms.  At this point she is much improved her workup reassuring.  I am suspicious this may be mild dehydration.  Currently had a significant shortage of supply of crystalloids.  She is appropriate to take by mouth, and will prescribe antiemetic  No evidence of ACS.  No evidence of acute intra-abdominal pathology on imaging today  Patient comfortable plan for discharge and careful return precautions        FINAL CLINICAL IMPRESSION(S) / ED DIAGNOSES   Final diagnoses:  Nausea  Other fatigue     Rx / DC Orders   ED Discharge Orders          Ordered    ondansetron (ZOFRAN-ODT) 4 MG disintegrating tablet  Every 6 hours PRN        10/18/22 1902             Note:  This document was prepared using Dragon voice recognition software and may include unintentional dictation errors.   Sharyn Creamer, MD 10/18/22 Ebony Cargo

## 2022-10-18 NOTE — ED Notes (Signed)
Pt transported to CT ?

## 2022-10-18 NOTE — ED Triage Notes (Signed)
Pt via POV from home. Pt c/o nausea denies vomiting, dizziness, and indigestion since yesterday. Report it feels like heartburn intermittently, it comes and goes. Reports some SOB. Denies cardiac hx and denies hx of GERD. Pt is A&OX4 and NAD

## 2022-10-19 ENCOUNTER — Emergency Department (HOSPITAL_COMMUNITY): Payer: Medicare HMO

## 2022-10-19 ENCOUNTER — Other Ambulatory Visit: Payer: Self-pay

## 2022-10-19 ENCOUNTER — Encounter (HOSPITAL_COMMUNITY): Payer: Self-pay | Admitting: Emergency Medicine

## 2022-10-19 ENCOUNTER — Inpatient Hospital Stay (HOSPITAL_COMMUNITY)
Admission: EM | Admit: 2022-10-19 | Discharge: 2022-10-21 | DRG: 312 | Disposition: A | Discharge status: Home / Self Care | Payer: Medicare HMO | Attending: Internal Medicine | Admitting: Internal Medicine

## 2022-10-19 DIAGNOSIS — E1122 Type 2 diabetes mellitus with diabetic chronic kidney disease: Secondary | ICD-10-CM | POA: Diagnosis present

## 2022-10-19 DIAGNOSIS — Z7982 Long term (current) use of aspirin: Secondary | ICD-10-CM

## 2022-10-19 DIAGNOSIS — Z853 Personal history of malignant neoplasm of breast: Secondary | ICD-10-CM

## 2022-10-19 DIAGNOSIS — Z923 Personal history of irradiation: Secondary | ICD-10-CM

## 2022-10-19 DIAGNOSIS — R110 Nausea: Principal | ICD-10-CM | POA: Diagnosis present

## 2022-10-19 DIAGNOSIS — Z882 Allergy status to sulfonamides status: Secondary | ICD-10-CM

## 2022-10-19 DIAGNOSIS — R55 Syncope and collapse: Secondary | ICD-10-CM | POA: Diagnosis not present

## 2022-10-19 DIAGNOSIS — I951 Orthostatic hypotension: Principal | ICD-10-CM | POA: Diagnosis present

## 2022-10-19 DIAGNOSIS — Z85828 Personal history of other malignant neoplasm of skin: Secondary | ICD-10-CM

## 2022-10-19 DIAGNOSIS — Z79890 Hormone replacement therapy (postmenopausal): Secondary | ICD-10-CM

## 2022-10-19 DIAGNOSIS — E119 Type 2 diabetes mellitus without complications: Secondary | ICD-10-CM

## 2022-10-19 DIAGNOSIS — E890 Postprocedural hypothyroidism: Secondary | ICD-10-CM | POA: Diagnosis present

## 2022-10-19 DIAGNOSIS — I129 Hypertensive chronic kidney disease with stage 1 through stage 4 chronic kidney disease, or unspecified chronic kidney disease: Secondary | ICD-10-CM | POA: Diagnosis present

## 2022-10-19 DIAGNOSIS — E039 Hypothyroidism, unspecified: Secondary | ICD-10-CM | POA: Diagnosis present

## 2022-10-19 DIAGNOSIS — Z86006 Personal history of melanoma in-situ: Secondary | ICD-10-CM

## 2022-10-19 DIAGNOSIS — R001 Bradycardia, unspecified: Secondary | ICD-10-CM | POA: Diagnosis present

## 2022-10-19 DIAGNOSIS — Z820 Family history of epilepsy and other diseases of the nervous system: Secondary | ICD-10-CM

## 2022-10-19 DIAGNOSIS — G35 Multiple sclerosis: Secondary | ICD-10-CM | POA: Diagnosis present

## 2022-10-19 DIAGNOSIS — F32A Depression, unspecified: Secondary | ICD-10-CM | POA: Diagnosis present

## 2022-10-19 DIAGNOSIS — Z79811 Long term (current) use of aromatase inhibitors: Secondary | ICD-10-CM

## 2022-10-19 DIAGNOSIS — Z79899 Other long term (current) drug therapy: Secondary | ICD-10-CM

## 2022-10-19 DIAGNOSIS — N1831 Chronic kidney disease, stage 3a: Secondary | ICD-10-CM | POA: Diagnosis present

## 2022-10-19 DIAGNOSIS — E7800 Pure hypercholesterolemia, unspecified: Secondary | ICD-10-CM | POA: Diagnosis present

## 2022-10-19 DIAGNOSIS — E860 Dehydration: Secondary | ICD-10-CM | POA: Diagnosis present

## 2022-10-19 DIAGNOSIS — J449 Chronic obstructive pulmonary disease, unspecified: Secondary | ICD-10-CM | POA: Diagnosis present

## 2022-10-19 DIAGNOSIS — N390 Urinary tract infection, site not specified: Secondary | ICD-10-CM | POA: Diagnosis present

## 2022-10-19 DIAGNOSIS — Z800 Family history of malignant neoplasm of digestive organs: Secondary | ICD-10-CM

## 2022-10-19 DIAGNOSIS — K219 Gastro-esophageal reflux disease without esophagitis: Secondary | ICD-10-CM | POA: Diagnosis present

## 2022-10-19 DIAGNOSIS — Z888 Allergy status to other drugs, medicaments and biological substances status: Secondary | ICD-10-CM

## 2022-10-19 DIAGNOSIS — E1142 Type 2 diabetes mellitus with diabetic polyneuropathy: Secondary | ICD-10-CM | POA: Diagnosis present

## 2022-10-19 DIAGNOSIS — C50419 Malignant neoplasm of upper-outer quadrant of unspecified female breast: Secondary | ICD-10-CM | POA: Diagnosis present

## 2022-10-19 DIAGNOSIS — Z803 Family history of malignant neoplasm of breast: Secondary | ICD-10-CM

## 2022-10-19 DIAGNOSIS — G35D Multiple sclerosis, unspecified: Secondary | ICD-10-CM | POA: Diagnosis present

## 2022-10-19 LAB — CBC WITH DIFFERENTIAL/PLATELET
Abs Immature Granulocytes: 0.03 10*3/uL (ref 0.00–0.07)
Basophils Absolute: 0.1 10*3/uL (ref 0.0–0.1)
Basophils Relative: 1 %
Eosinophils Absolute: 0.1 10*3/uL (ref 0.0–0.5)
Eosinophils Relative: 1 %
HCT: 42 % (ref 36.0–46.0)
Hemoglobin: 13.7 g/dL (ref 12.0–15.0)
Immature Granulocytes: 0 %
Lymphocytes Relative: 31 %
Lymphs Abs: 2.9 10*3/uL (ref 0.7–4.0)
MCH: 30.3 pg (ref 26.0–34.0)
MCHC: 32.6 g/dL (ref 30.0–36.0)
MCV: 92.9 fL (ref 80.0–100.0)
Monocytes Absolute: 0.6 10*3/uL (ref 0.1–1.0)
Monocytes Relative: 6 %
Neutro Abs: 5.6 10*3/uL (ref 1.7–7.7)
Neutrophils Relative %: 61 %
Platelets: 267 10*3/uL (ref 150–400)
RBC: 4.52 MIL/uL (ref 3.87–5.11)
RDW: 13.1 % (ref 11.5–15.5)
WBC: 9.3 10*3/uL (ref 4.0–10.5)
nRBC: 0 % (ref 0.0–0.2)

## 2022-10-19 LAB — COMPREHENSIVE METABOLIC PANEL
ALT: 30 U/L (ref 0–44)
AST: 31 U/L (ref 15–41)
Albumin: 4.2 g/dL (ref 3.5–5.0)
Alkaline Phosphatase: 76 U/L (ref 38–126)
Anion gap: 14 (ref 5–15)
BUN: 14 mg/dL (ref 8–23)
CO2: 21 mmol/L — ABNORMAL LOW (ref 22–32)
Calcium: 9.9 mg/dL (ref 8.9–10.3)
Chloride: 99 mmol/L (ref 98–111)
Creatinine, Ser: 1.11 mg/dL — ABNORMAL HIGH (ref 0.44–1.00)
GFR, Estimated: 50 mL/min — ABNORMAL LOW (ref >60)
Glucose, Bld: 122 mg/dL — ABNORMAL HIGH (ref 70–99)
Potassium: 4 mmol/L (ref 3.5–5.1)
Sodium: 134 mmol/L — ABNORMAL LOW (ref 135–145)
Total Bilirubin: 1.2 mg/dL (ref 0.3–1.2)
Total Protein: 8.6 g/dL — ABNORMAL HIGH (ref 6.5–8.1)

## 2022-10-19 LAB — CBG MONITORING, ED: Glucose-Capillary: 118 mg/dL — ABNORMAL HIGH (ref 70–99)

## 2022-10-19 LAB — HEMOGLOBIN A1C
Hgb A1c MFr Bld: 6.7 % — ABNORMAL HIGH (ref 4.8–5.6)
Mean Plasma Glucose: 145.59 mg/dL

## 2022-10-19 LAB — TROPONIN I (HIGH SENSITIVITY)
Troponin I (High Sensitivity): 6 ng/L (ref <18)
Troponin I (High Sensitivity): 6 ng/L (ref <18)

## 2022-10-19 LAB — MAGNESIUM: Magnesium: 2.3 mg/dL (ref 1.7–2.4)

## 2022-10-19 LAB — LIPASE, BLOOD: Lipase: 25 U/L (ref 11–51)

## 2022-10-19 MED ORDER — SODIUM CHLORIDE 0.9 % IV BOLUS
500.0000 mL | Freq: Once | INTRAVENOUS | Status: AC
  Administered 2022-10-19: 500 mL via INTRAVENOUS

## 2022-10-19 MED ORDER — INSULIN ASPART 100 UNIT/ML IJ SOLN: 0.0000 [IU] | Freq: Every day | INTRAMUSCULAR | Status: DC

## 2022-10-19 MED ORDER — SODIUM CHLORIDE 0.9 % IV SOLN: Freq: Once | INTRAVENOUS | Status: AC

## 2022-10-19 MED ORDER — ONDANSETRON HCL 4 MG/2ML IJ SOLN
4.0000 mg | Freq: Once | INTRAMUSCULAR | Status: AC
  Administered 2022-10-19: 4 mg via INTRAVENOUS
  Filled 2022-10-19: qty 2

## 2022-10-19 MED ORDER — INSULIN ASPART 100 UNIT/ML IJ SOLN
0.0000 [IU] | Freq: Three times a day (TID) | INTRAMUSCULAR | Status: DC
  Administered 2022-10-21: 1 [IU] via SUBCUTANEOUS

## 2022-10-19 NOTE — ED Provider Notes (Signed)
[place] EMERGENCY DEPARTMENT AT [facility] Provider Note   CSN: [id number] Arrival date & time: 10/19/22  2030     History  Chief Complaint  Patient presents with   Abdominal Pain   Nausea    Amore Ackman is a 82 y.o. female.  82 year old female with prior medical history detailed below presents for evaluation.  Patient reports diffuse abdominal "discomfort" with associated nausea.  Patient reports minimal to no emesis.  She reports difficulty taking any p.o. secondary to symptoms.  Patient was seen yesterday at [facility] ED for same.  Symptoms began over the weekend.  Workup performed at [facility] yesterday was without significant abnormality identified.  Patient felt much improved after antiemetics administered in the ED.  Patient was sent home with Zofran.  She reports that throughout the course of today Zofran has been ineffective at controlling her nausea.  Patient with likely near syncopal event while in triage here at the ED - perhaps associated with nausea.  At the time of my evaluation, the patient is alert, oriented x 4, and complains of nausea.  She denies significant abdominal pain.  She denies associated fever.  The history is provided by the patient and medical records. No language interpreter was used.       Home Medications Prior to Admission medications   Medication Sig Start Date End Date Taking? Authorizing Provider  albuterol (VENTOLIN HFA) 108 (90 Base) MCG/ACT inhaler Inhale into the lungs. 02/03/21   [provider]  anastrozole (ARIMIDEX) 1 MG tablet Take 1 tablet by mouth daily. Patient not taking: Reported on 04/02/2021 03/05/14   [provider]  aspirin EC 81 MG tablet Take 1 tablet by mouth daily. Patient not taking: Reported on 04/02/2021    [provider]  cetirizine (ZYRTEC) 10 MG tablet Take 10 mg by mouth daily.     [provider]  Cholecalciferol 25 MCG (1000 UT) tablet Take 4,000 Units by mouth 2 (two) times  daily. 2000 mg in morning and again at night    [provider]  Cinnamon 500 MG capsule Take 1,000 mg by mouth 2 (two) times daily.  Patient not taking: Reported on 04/02/2021    [provider]  DULoxetine (CYMBALTA) 30 MG capsule Take 30 mg by mouth daily.  06/18/17   [provider]  levothyroxine (SYNTHROID, LEVOTHROID) 88 MCG tablet Take 88 mcg by mouth daily before breakfast.  07/20/14   [provider]  lisinopril (PRINIVIL,ZESTRIL) 10 MG tablet Take 5 mg by mouth daily.  Patient not taking: Reported on 04/02/2021 09/24/14   [provider]  ondansetron (ZOFRAN-ODT) 4 MG disintegrating tablet Take 1 tablet (4 mg total) by mouth every 6 (six) hours as needed for nausea or vomiting. 10/18/22   Sharyn Creamer, MD  pantoprazole (PROTONIX) 40 MG tablet Take 40 mg by mouth daily.  09/24/14   [provider]  simvastatin (ZOCOR) 10 MG tablet Take 5 mg by mouth every evening.  11/01/14   [provider]  triamcinolone cream (KENALOG) 0.1 % apply twice daily as needed to affected areas for two weeks then decrease to  5 days a week 02/10/21   Deirdre Evener, MD  trimethoprim (TRIMPEX) 100 MG tablet Take 100 mg by mouth at bedtime. Patient not taking: Reported on 04/02/2021 02/21/18   [provider]      Allergies    Floxacillin (flucloxacillin), Cephalexin, Ciprofloxacin, and Metronidazole    Review of Systems   Review of Systems  All other systems reviewed and are negative.   Physical Exam Updated Vital Signs BP (!) 197/73 (BP Location: Left Arm)   Pulse 61   Temp 97.9 F (36.6 C) (Oral)   Resp 18   Wt 64 kg   SpO2 100%   BMI 26.66 kg/m  Physical Exam Vitals and nursing note reviewed.  Constitutional:      General: She is not in acute distress.    Appearance: Normal appearance. She is well-developed.  HENT:     Head: Normocephalic and atraumatic.     Mouth/Throat:     Mouth: Mucous membranes are dry.  Eyes:      Conjunctiva/sclera: Conjunctivae normal.     Pupils: Pupils are equal, round, and reactive to light.  Cardiovascular:     Rate and Rhythm: Normal rate and regular rhythm.     Heart sounds: Normal heart sounds.  Pulmonary:     Effort: Pulmonary effort is normal. No respiratory distress.     Breath sounds: Normal breath sounds.  Abdominal:     General: There is no distension.     Palpations: Abdomen is soft.     Tenderness: There is no abdominal tenderness.  Musculoskeletal:        General: No deformity. Normal range of motion.     Cervical back: Normal range of motion and neck supple.  Skin:    General: Skin is warm and dry.  Neurological:     General: No focal deficit present.     Mental Status: She is alert and oriented to person, place, and time.     ED Results / Procedures / Treatments   Labs (all labs ordered are listed, but only abnormal results are displayed) Labs Reviewed  CBC WITH DIFFERENTIAL/PLATELET  LIPASE, BLOOD  COMPREHENSIVE METABOLIC PANEL  URINALYSIS, ROUTINE W REFLEX MICROSCOPIC  MAGNESIUM  TROPONIN I (HIGH SENSITIVITY)    EKG EKG Interpretation Date/Time:  Monday October 19 2022 20:38:33 EDT Ventricular Rate:  64 PR Interval:  177 QRS Duration:  78 QT Interval:  422 QTC Calculation: 436 R Axis:   -22  Text Interpretation: Sinus rhythm Borderline left axis deviation Baseline wander in lead(s) V4 V6 Confirmed by Kristine Royal [phone]) on 10/19/2022 8:44:30 PM  Radiology CT ABDOMEN PELVIS W CONTRAST  Result Date: 10/18/2022 CLINICAL DATA:  Bowel obstruction suspected. Patient presents from home with nausea. EXAM: CT ABDOMEN AND PELVIS WITH CONTRAST TECHNIQUE: Multidetector CT imaging of the abdomen and pelvis was performed using the standard protocol following bolus administration of intravenous contrast. RADIATION DOSE REDUCTION: This exam was performed according to the departmental dose-optimization program which includes automated exposure control,  adjustment of the mA and/or kV according to patient size and/or use of iterative reconstruction technique. CONTRAST:  75mL OMNIPAQUE IOHEXOL 300 MG/ML  SOLN COMPARISON:  07/08/2018 FINDINGS: Lower chest: No acute abnormality. Chronic interstitial changes with subpleural and parenchymal interstitial reticulation. Nodule within the lateral left lung base measures 7 mm, image 22/4. This is unchanged from 10/11/2020. Hepatobiliary: No focal liver abnormality is seen. No gallstones, gallbladder wall thickening, or biliary dilatation. Pancreas: No inflammation or mass identified. Measures 5 mm and is unchanged from 07/08/2018. Spleen: Normal in size without focal abnormality. Adrenals/Urinary Tract: Normal adrenal glands. Punctate stone noted within the upper pole of the right kidney, image 28/2. No kidney mass, or hydronephrosis identified. Urinary bladder is unremarkable. Stomach/Bowel: The stomach appears unremarkable. Status post appendectomy. No pathologic dilatation of the large or small bowel loops to suggest an obstruction. Colonic  diverticulosis identified. No signs of acute diverticulitis. Vascular/Lymphatic: Aortic atherosclerosis. The upper abdominal vascularity is patent. No signs of abdominopelvic adenopathy. Reproductive: Pessary device is identified. Uterus and adnexal structures are unremarkable. Other: No free air.  There is no free fluid or fluid collections. Musculoskeletal: No acute or significant osseous findings. IMPRESSION: 1. No acute findings within the abdomen or pelvis. No signs of bowel obstruction. 2. Colonic diverticulosis without signs of acute diverticulitis. 3. Punctate nonobstructing right renal calculus. 4. Chronic interstitial changes within the lung bases. 5. Nodule within the lateral left lung base measures 7 mm. This is unchanged when compared with 10/12/2022. Findings compatible with a benign nodule requiring no further follow-up. 6.  Aortic Atherosclerosis (ICD10-I70.0).  Electronically Signed   By: Signa Kell M.D.   On: 10/18/2022 18:25   DG Chest 2 View  Result Date: 10/18/2022 CLINICAL DATA:  Nausea.  Heartburn.  Shortness of breath. EXAM: CHEST - 2 VIEW COMPARISON:  09/23/2020 FINDINGS: Patient rotated right. Midline trachea. Mild cardiomegaly. Atherosclerosis in the transverse aorta. Mild right hemidiaphragm elevation. No pleural effusion or pneumothorax. 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 No lobar consolidation. IMPRESSION: Chronic interstitial coarsening as detailed above. No superimposed acute process. Aortic Atherosclerosis (ICD10-I70.0). Electronically Signed   By: Jeronimo Greaves M.D.   On: 10/18/2022 16:34    Procedures Procedures    Medications Ordered in ED Medications  sodium chloride 0.9 % bolus 500 mL (has no administration in time range)  ondansetron (ZOFRAN) injection 4 mg (has no administration in time range)    ED Course/ Medical Decision Making/ A&P                                 Medical Decision Making Amount and/or Complexity of Data Reviewed Labs: ordered. Radiology: ordered.  Risk Prescription drug management.    Medical Screen Complete  This patient presented to the ED with complaint of nausea, syncope.  This complaint involves an extensive number of treatment options. The initial differential diagnosis includes, but is not limited to, metabolic abnormality, dehydration, AKI, UTI, etc.  This presentation is: Acute, Self-Limited, Previously Undiagnosed, Uncertain Prognosis, Complicated, Systemic Symptoms, and Threat to Life/Bodily Function  Patient with significant nausea x 2 to 3 days.  Evaluation at [facility] ED yesterday was without significant abnormality identified.  Patient returns tonight with continued nausea,  decreased p.o. intake, and syncopal episode in triage possibly associated with nausea.  Repeat labs are without significant abnormality.  Patient is feeling improved with IV fluids and antiemetics.  Patient would benefit from overnight observation and IV fluid administration.  Admitting service is aware of case and evaluate for same.  Additional history obtained:  Additional history obtained from Spouse and Family External records from outside sources obtained and reviewed including prior ED visits and prior Inpatient records.    Lab Tests:  I ordered and personally interpreted labs.  The pertinent results include: CBC, CMP, troponin, lipase, magnesium, UA pending at time of admission   Imaging Studies ordered:  I ordered imaging studies including CT head, chest x-ray I independently visualized and interpreted obtained imaging which showed NAD I agree with the radiologist interpretation.   Cardiac Monitoring:  The patient was maintained on a cardiac monitor.  I personally viewed and interpreted the cardiac monitor which showed an underlying rhythm  of: NSR   Medicines ordered:  I ordered medication including IV fluids, antiemetics for nausea, suspected dehydration Reevaluation of the patient after these medicines showed that the patient: improved   Problem List / ED Course:  Syncope, dehydration, nausea   Reevaluation:  After the interventions noted above, I reevaluated the patient and found that they have: improved  Disposition:  After consideration of the diagnostic results and the patients response to treatment, I feel that the patent would benefit from admission.          Final Clinical Impression(s) / ED Diagnoses Final diagnoses:  Nausea  Syncope, unspecified syncope type    Rx / DC Orders ED Discharge Orders     None         Wynetta Fines, MD 10/19/22 2210

## 2022-10-19 NOTE — ED Triage Notes (Signed)
Patient POV from home.  Patient seen yesterday at [facility] for abdominal pain, n/v.  Patient feeling worse today, had syncopal episode while waiting at check in.  Patient disoriented upon assessment.

## 2022-10-19 NOTE — ED Notes (Signed)
Dr. Mikeal Hawthorne ( admitting MD ) at bedside , notified on abnormal heart rate /hypertension and persistent nausea/gastric reflux.

## 2022-10-19 NOTE — H&P (Signed)
History and Physical    Patient: Christina Cuevas UJW:[id number] DOB: Jun 20, 1940 DOA: 10/19/2022 DOS: the patient was seen and examined on 10/19/2022 PCP: Marisue Ivan, MD  Patient coming from: Home  Chief Complaint:  Chief Complaint  Patient presents with   Abdominal Pain   Nausea   HPI: Christina Cuevas is a 82 y.o. female with medical history significant of left breast cancer which was treated with radiation, chronic kidney disease stage II, hypertension, hypothyroidism, hyperlipidemia, type 2 diabetes who has been having persistent nausea but no vomiting.  She was seen in the ER yesterday and treated.  Patient was sent home with prescriptions.  The nausea persisted so she came back to the ER today for reevaluation.  While in the waiting room patient had a syncopal episode.  She is noted to be bradycardic.  Patient has no injuries.  Did not hit her head.  Husband and son were around.  Her husband witnessed the fall.  She had no recollection of what happened.  No prior syncopal episode.  No known cardiac disease.  Review of Systems: As mentioned in the history of present illness. All other systems reviewed and are negative. 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 Slight to moderate atypia   Dysplastic nevus 01/18/2007   Right sup lat thigh. Moderate atypia, extends to one edge.   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 Excised 01/10/2007, residual MIS, edges free   Pre-diabetes    diet controlled   Type 2 diabetes mellitus without complication, without long-term current use of insulin (HCC) 12/20/2017   Past Surgical  History:  Procedure Laterality Date   APPENDECTOMY  1950   BREAST LUMPECTOMY Left 02/2009   lymph nodes removed   BREAST SURGERY Right 02/2009   sclerosing lesion removed at same time   BUNIONECTOMY Left 03/25/2018   Procedure: DOUBLE OSTEOTOMY;  Surgeon: Gwyneth Revels, DPM;  Location: [facility] ORS;  Service: Podiatry;  Laterality: Left;   COLONOSCOPY     Social History:  reports that she has never smoked. She has never used smokeless tobacco. She reports that she does not drink alcohol and does not use drugs.  Allergies  Allergen Reactions   Floxacillin (Flucloxacillin)    Cephalexin Nausea Only   Ciprofloxacin Nausea Only   Metronidazole Nausea Only    Family History  Problem Relation Age of Onset   Parkinson's disease Mother    Pancreatic cancer Father    Cancer Paternal Uncle        2 uncles    Migraines Maternal Grandmother    Breast cancer Cousin     Prior to Admission medications   Medication Sig Start Date End Date Taking? Authorizing Provider  albuterol (VENTOLIN HFA) 108 (90 Base) MCG/ACT inhaler Inhale into the lungs. 02/03/21   [provider]  anastrozole (ARIMIDEX) 1 MG tablet Take 1 tablet by mouth daily. Patient not taking: Reported on 04/02/2021 03/05/14   [provider]  aspirin EC 81 MG tablet Take 1 tablet by mouth daily. Patient not taking: Reported on  04/02/2021    [provider]  cetirizine (ZYRTEC) 10 MG tablet Take 10 mg by mouth daily.     [provider]  Cholecalciferol 25 MCG (1000 UT) tablet Take 4,000 Units by mouth 2 (two) times daily. 2000 mg in morning and again at night    [provider]  Cinnamon 500 MG capsule Take 1,000 mg by mouth 2 (two) times daily.  Patient not taking: Reported on 04/02/2021    [provider]  DULoxetine (CYMBALTA) 30 MG capsule Take 30 mg by mouth daily.  06/18/17   [provider]  levothyroxine (SYNTHROID, LEVOTHROID) 88 MCG tablet Take 88 mcg by mouth daily  before breakfast.  07/20/14   [provider]  lisinopril (PRINIVIL,ZESTRIL) 10 MG tablet Take 5 mg by mouth daily.  Patient not taking: Reported on 04/02/2021 09/24/14   [provider]  ondansetron (ZOFRAN-ODT) 4 MG disintegrating tablet Take 1 tablet (4 mg total) by mouth every 6 (six) hours as needed for nausea or vomiting. 10/18/22   Sharyn Creamer, MD  pantoprazole (PROTONIX) 40 MG tablet Take 40 mg by mouth daily.  09/24/14   [provider]  simvastatin (ZOCOR) 10 MG tablet Take 5 mg by mouth every evening.  11/01/14   [provider]  triamcinolone cream (KENALOG) 0.1 % apply twice daily as needed to affected areas for two weeks then decrease to  5 days a week 02/10/21   Deirdre Evener, MD  trimethoprim (TRIMPEX) 100 MG tablet Take 100 mg by mouth at bedtime. Patient not taking: Reported on 04/02/2021 02/21/18   [provider]    Physical Exam: Vitals:   10/19/22 2036 10/19/22 2039 10/19/22 2115 10/19/22 2210  BP:  (!) 197/73 135/74 (!) 189/72  Pulse:  61 63 (!) 51  Resp:  18 (!) 21 16  Temp:  97.9 F (36.6 C)    TempSrc:  Oral    SpO2:  100% 100% 100%  Weight: 64 kg      Constitutional: Acutely ill looking no distress NAD, calm, comfortable Eyes: PERRL, lids and conjunctivae normal ENMT: Mucous membranes are moist. Posterior pharynx clear of any exudate or lesions.Normal dentition.  Neck: normal, supple, no masses, no thyromegaly Respiratory: clear to auscultation bilaterally, no wheezing, no crackles. Normal respiratory effort. No accessory muscle use.  Cardiovascular: Sinus bradycardia, no murmurs / rubs / gallops. No extremity edema. 2+ pedal pulses. No carotid bruits.  Abdomen: no tenderness, no masses palpated. No hepatosplenomegaly. Bowel sounds positive.  Musculoskeletal: Good range of motion, no joint swelling or tenderness, Skin: no rashes, lesions, ulcers. No induration Neurologic: CN 2-12 grossly intact. Sensation intact, DTR  normal. Strength 5/5 in all 4.  Psychiatric: Normal judgment and insight. Alert and oriented x 3. Normal mood  Data Reviewed:  Heart rate between upper 40s and 50s, sodium 134, potassium 4.2, chloride 99 CO2 21 glucose 122 BUN is 14 creatinine 1.11 and total protein 8.6.  Head CT without contrast showed no acute findings.  Chest x-ray showed no active disease.  EKG showed sinus rhythm with no significant ST changes.  Assessment and Plan:  #1 syncope: Suspected vasovagal.  Patient mildly dehydrated.  Also noted to be bradycardic.  Will admit the patient for observation.  Get echocardiogram.  Hydrate.  Check orthostatics.  #2 persistent nausea: No obvious cause.  Urine to be checked for UTI.  Patient is a diabetic.  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  No new medications.  No  sick contact.  Patient also has no other GI complaints including diarrhea and bright red blood per rectum.  We will continue to treat this symptomatically with IV Zofran.  #3 type 2 diabetes: Will initiate sliding scale insulin.  #4 chronic kidney disease stage IIIa; appears to be at baseline.  #5 GERD: Continue with PPIs  #6 history of MS: Continue home regimen  #7 hypothyroidism: Continue with levothyroxine.  #8 COPD: No acute exacerbation.    Advance Care Planning:   Code Status: Full Code   Consults: None  Family Communication: Husband and son at bedside  Severity of Illness: The appropriate patient status for this patient is OBSERVATION. Observation status is judged to be reasonable and necessary in order to provide the required intensity of service to ensure the patient's safety. The patient's presenting symptoms, physical exam findings, and initial radiographic and laboratory data in the context of their medical condition is felt to place them at decreased risk for further clinical deterioration. Furthermore, it is anticipated that the patient will be medically stable for discharge from  the hospital within 2 midnights of admission.   AuthorLonia Blood, MD 10/19/2022 10:34 PM  For on call review [email or web address].

## 2022-10-19 NOTE — ED Notes (Signed)
ED TO INPATIENT HANDOFF REPORT  ED Nurse Name and Phone #:  Lucious Groves [phone]  S Name/Age/Gender Christina Cuevas 82 y.o. female Room/Bed: TRACC/TRACC  Code Status   Code Status: Full Code  Home/SNF/Other Home Patient oriented to: self, place, time, and situation Is this baseline? Yes   Triage Complete: Triage complete  Chief Complaint Syncope and collapse [R55]  Triage Note Patient POV from home.  Patient seen yesterday at [facility] for abdominal pain, n/v.  Patient feeling worse today, had syncopal episode while waiting at check in.  Patient disoriented upon assessment.    Allergies Allergies  Allergen Reactions   Floxacillin (Flucloxacillin)    Cephalexin Nausea Only   Ciprofloxacin Nausea Only   Metronidazole Nausea Only    Level of Care/Admitting Diagnosis ED Disposition     ED Disposition  Admit   Condition  --   Comment  Hospital Area: [facility] [[id number]]  Level of Care: Telemetry Cardiac [[id number]]  May place patient in observation at [facility] or [facility] if equivalent level of care is available:: No  Covid Evaluation: Asymptomatic - no recent exposure (last 10 days) testing not required  Diagnosis: Syncope and collapse [780.2.ICD-9-CM]  Admitting Physician: Rometta Emery [[id number]]  Attending Physician: Rometta Emery [[id number]]          B Medical/Surgery History Past Medical History:  Diagnosis Date   Actinic keratosis    Basal cell carcinoma 05/18/2006   Left pretibia   Breast cancer (HCC) 2011   left breast cancer treated with radiation   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 Slight to moderate atypia   Dysplastic nevus 01/18/2007   Right sup lat thigh. Moderate atypia, extends to one edge.   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  Excised 01/10/2007, residual MIS, edges free   Pre-diabetes    diet controlled   Type 2 diabetes mellitus without complication, without long-term current use of insulin (HCC) 12/20/2017   Past Surgical History:  Procedure Laterality Date   APPENDECTOMY  1950   BREAST LUMPECTOMY Left 02/2009   lymph nodes removed   BREAST SURGERY Right 02/2009   sclerosing lesion removed at same time   BUNIONECTOMY Left 03/25/2018   Procedure: DOUBLE OSTEOTOMY;  Surgeon: Gwyneth Revels, DPM;  Location: [facility] ORS;  Service: Podiatry;  Laterality: Left;   COLONOSCOPY       A IV Location/Drains/Wounds Patient Lines/Drains/Airways Status     Active Line/Drains/Airways     Name Placement date Placement time Site Days   Peripheral IV 10/19/22 20 G Right Antecubital 10/19/22  2038  Antecubital  less than 1            Intake/Output Last 24 hours  Intake/Output Summary (Last 24 hours) at 10/19/2022 2252 Last data filed at 10/19/2022 2216 Gross per 24 hour  Intake 500 ml  Output --  Net 500 ml    Labs/Imaging Results for orders placed or performed during the hospital encounter of 10/19/22 (from the past 48 hour(s))  Troponin I (High Sensitivity)     Status: None   Collection Time: 10/19/22  8:38 PM  Result Value Ref Range   Troponin I (High Sensitivity) 6 <18 ng/L    Comment: (NOTE) Elevated high  sensitivity troponin I (hsTnI) values and significant  changes across serial measurements may suggest ACS but many other  chronic and acute conditions are known to elevate hsTnI results.  Refer to the "Links" section for chest pain algorithms and additional  guidance. Performed at [facility], [address], [place], [place] [address]   CBC with Differential     Status: None   Collection Time: 10/19/22  8:38 PM  Result Value Ref Range   WBC 9.3 4.0 - 10.5 K/uL   RBC 4.52 3.87 - 5.11 MIL/uL   Hemoglobin 13.7 12.0 - 15.0 g/dL   HCT 60.4 54.0 - 98.1 %   MCV 92.9 80.0 - 100.0 fL   MCH  30.3 26.0 - 34.0 pg   MCHC 32.6 30.0 - 36.0 g/dL   RDW 19.1 47.8 - 29.5 %   Platelets 267 150 - 400 K/uL   nRBC 0.0 0.0 - 0.2 %   Neutrophils Relative % 61 %   Neutro Abs 5.6 1.7 - 7.7 K/uL   Lymphocytes Relative 31 %   Lymphs Abs 2.9 0.7 - 4.0 K/uL   Monocytes Relative 6 %   Monocytes Absolute 0.6 0.1 - 1.0 K/uL   Eosinophils Relative 1 %   Eosinophils Absolute 0.1 0.0 - 0.5 K/uL   Basophils Relative 1 %   Basophils Absolute 0.1 0.0 - 0.1 K/uL   Immature Granulocytes 0 %   Abs Immature Granulocytes 0.03 0.00 - 0.07 K/uL    Comment: Performed at [facility], [address] [place], [place] [address]  Lipase, blood     Status: None   Collection Time: 10/19/22  8:38 PM  Result Value Ref Range   Lipase 25 11 - 51 U/L    Comment: Performed at [facility], [address] [place], [place] [address]  Comprehensive metabolic panel     Status: Abnormal   Collection Time: 10/19/22  8:38 PM  Result Value Ref Range   Sodium 134 (L) 135 - 145 mmol/L   Potassium 4.0 3.5 - 5.1 mmol/L   Chloride 99 98 - 111 mmol/L   CO2 21 (L) 22 - 32 mmol/L   Glucose, Bld 122 (H) 70 - 99 mg/dL    Comment: Glucose reference range applies only to samples taken after fasting for at least 8 hours.   BUN 14 8 - 23 mg/dL   Creatinine, Ser 4.69 (H) 0.44 - 1.00 mg/dL   Calcium 9.9 8.9 - 62.9 mg/dL   Total Protein 8.6 (H) 6.5 - 8.1 g/dL   Albumin 4.2 3.5 - 5.0 g/dL   AST 31 15 - 41 U/L   ALT 30 0 - 44 U/L   Alkaline Phosphatase 76 38 - 126 U/L   Total Bilirubin 1.2 0.3 - 1.2 mg/dL   GFR, Estimated 50 (L) >60 mL/min    Comment: (NOTE) Calculated using the CKD-EPI Creatinine Equation (2021)    Anion gap 14 5 - 15    Comment: Performed at [facility], [address], [place], [place] [address]  Magnesium     Status: None   Collection Time: 10/19/22  8:38 PM  Result Value Ref Range   Magnesium 2.3 1.7 - 2.4 mg/dL    Comment: Performed at [facility], [address],  [place], [place] [address]   CT Head Wo Contrast  Result Date: 10/19/2022 CLINICAL DATA:  Syncope. EXAM: CT HEAD WITHOUT CONTRAST TECHNIQUE: Contiguous axial images were obtained from  the base of the skull through the vertex without intravenous contrast. RADIATION DOSE REDUCTION: This exam was performed according to the departmental dose-optimization program which includes automated exposure control, adjustment of the mA and/or kV according to patient size and/or use of iterative reconstruction technique. COMPARISON:  Head CT dated 05/23/2022. FINDINGS: Brain: Mild age-related atrophy and chronic microvascular ischemic changes. There is no acute intracranial hemorrhage. No mass effect or midline shift. No extra-axial fluid collection. Vascular: No hyperdense vessel or unexpected calcification. Skull: Normal. Negative for fracture or focal lesion. Sinuses/Orbits: No acute finding. Other: None IMPRESSION: 1. No acute intracranial pathology. 2. Mild age-related atrophy and chronic microvascular ischemic changes. Electronically Signed   By: Elgie Collard M.D.   On: 10/19/2022 21:48   DG Chest Port 1 View  Result Date: 10/19/2022 CLINICAL DATA:  Syncope. EXAM: PORTABLE CHEST 1 VIEW COMPARISON:  Chest radiograph dated 10/18/2022. FINDINGS: There is diffuse chronic interstitial coarsening and bibasilar scarring. No focal consolidation, pleural effusion, or pneumothorax. Stable cardiac silhouette. Atherosclerotic calcification of the aorta. Degenerative changes of spine. No acute osseous pathology. IMPRESSION: No active disease. Electronically Signed   By: Elgie Collard M.D.   On: 10/19/2022 21:31   CT ABDOMEN PELVIS W CONTRAST  Result Date: 10/18/2022 CLINICAL DATA:  Bowel obstruction suspected. Patient presents from home with nausea. EXAM: CT ABDOMEN AND PELVIS WITH CONTRAST TECHNIQUE: Multidetector CT imaging of the abdomen and pelvis was performed using the standard protocol following bolus administration of  intravenous contrast. RADIATION DOSE REDUCTION: This exam was performed according to the departmental dose-optimization program which includes automated exposure control, adjustment of the mA and/or kV according to patient size and/or use of iterative reconstruction technique. CONTRAST:  75mL OMNIPAQUE IOHEXOL 300 MG/ML  SOLN COMPARISON:  07/08/2018 FINDINGS: Lower chest: No acute abnormality. Chronic interstitial changes with subpleural and parenchymal interstitial reticulation. Nodule within the lateral left lung base measures 7 mm, image 22/4. This is unchanged from 10/11/2020. Hepatobiliary: No focal liver abnormality is seen. No gallstones, gallbladder wall thickening, or biliary dilatation. Pancreas: No inflammation or mass identified. Measures 5 mm and is unchanged from 07/08/2018. Spleen: Normal in size without focal abnormality. Adrenals/Urinary Tract: Normal adrenal glands. Punctate stone noted within the upper pole of the right kidney, image 28/2. No kidney mass, or hydronephrosis identified. Urinary bladder is unremarkable. Stomach/Bowel: The stomach appears unremarkable. Status post appendectomy. No pathologic dilatation of the large or small bowel loops to suggest an obstruction. Colonic diverticulosis identified. No signs of acute diverticulitis. Vascular/Lymphatic: Aortic atherosclerosis. The upper abdominal vascularity is patent. No signs of abdominopelvic adenopathy. Reproductive: Pessary device is identified. Uterus and adnexal structures are unremarkable. Other: No free air.  There is no free fluid or fluid collections. Musculoskeletal: No acute or significant osseous findings. IMPRESSION: 1. No acute findings within the abdomen or pelvis. No signs of bowel obstruction. 2. Colonic diverticulosis without signs of acute diverticulitis. 3. Punctate nonobstructing right renal calculus. 4. Chronic interstitial changes within the lung bases. 5. Nodule within the lateral left lung base measures 7 mm.  This is unchanged when compared with 10/12/2022. Findings compatible with a benign nodule requiring no further follow-up. 6.  Aortic Atherosclerosis (ICD10-I70.0). Electronically Signed   By: Signa Kell M.D.   On: 10/18/2022 18:25   DG Chest 2 View  Result Date: 10/18/2022 CLINICAL DATA:  Nausea.  Heartburn.  Shortness of breath. EXAM: CHEST - 2 VIEW COMPARISON:  09/23/2020 FINDINGS: Patient rotated right. Midline trachea. Mild cardiomegaly. Atherosclerosis in the transverse aorta. Mild  right hemidiaphragm elevation. No pleural effusion or pneumothorax. 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 No lobar consolidation. IMPRESSION: Chronic interstitial coarsening as detailed above. No superimposed acute process. Aortic Atherosclerosis (ICD10-I70.0). Electronically Signed   By: Jeronimo Greaves M.D.   On: 10/18/2022 16:34    Pending Labs Unresulted Labs (From admission, onward)     Start     Ordered   10/19/22 2038  Urinalysis, Routine w reflex microscopic -Urine, Clean Catch  Once,   URGENT       Question:  Specimen Source  Answer:  Urine, Clean Catch   10/19/22 2038   Signed and Held  CBC  (enoxaparin (LOVENOX)    CrCl >/= 30 ml/min)  Once,   R       Comments: Baseline for enoxaparin therapy IF NOT ALREADY DRAWN.  Notify MD if PLT < 100 K.    Signed and Held   Signed and Held  Creatinine, serum  (enoxaparin (LOVENOX)    CrCl >/= 30 ml/min)  Once,   R       Comments: Baseline for enoxaparin therapy IF NOT ALREADY DRAWN.    Signed and Held   Signed and Held  Creatinine, serum  (enoxaparin (LOVENOX)    CrCl >/= 30 ml/min)  Weekly,   R     Comments: while on enoxaparin therapy    Signed and Held   Signed and Held  Comprehensive metabolic panel  Tomorrow morning,   R        Signed and Held   Signed and  Held  CBC  Tomorrow morning,   R        Signed and Held            Vitals/Pain Today's Vitals   10/19/22 2039 10/19/22 2115 10/19/22 2210 10/19/22 2217  BP: (!) 197/73 135/74 (!) 189/72   Pulse: 61 63 (!) 51   Resp: 18 (!) 21 16   Temp: 97.9 F (36.6 C)     TempSrc: Oral     SpO2: 100% 100% 100%   Weight:      PainSc:    0-No pain    Isolation Precautions No active isolations  Medications Medications  sodium chloride 0.9 % bolus 500 mL (0 mLs Intravenous Stopped 10/19/22 2216)  ondansetron (ZOFRAN) injection 4 mg (4 mg Intravenous Given 10/19/22 2135)  0.9 %  sodium chloride infusion ( Intravenous New Bag/Given 10/19/22 2217)    Mobility walks     Focused Assessments     R Recommendations: See Admitting Provider Note  Report given to:   Additional Notes:

## 2022-10-20 ENCOUNTER — Observation Stay (HOSPITAL_COMMUNITY): Payer: Medicare HMO

## 2022-10-20 DIAGNOSIS — Z800 Family history of malignant neoplasm of digestive organs: Secondary | ICD-10-CM | POA: Diagnosis not present

## 2022-10-20 DIAGNOSIS — E1122 Type 2 diabetes mellitus with diabetic chronic kidney disease: Secondary | ICD-10-CM | POA: Diagnosis present

## 2022-10-20 DIAGNOSIS — E1142 Type 2 diabetes mellitus with diabetic polyneuropathy: Secondary | ICD-10-CM | POA: Diagnosis present

## 2022-10-20 DIAGNOSIS — N1831 Chronic kidney disease, stage 3a: Secondary | ICD-10-CM | POA: Diagnosis present

## 2022-10-20 DIAGNOSIS — E7800 Pure hypercholesterolemia, unspecified: Secondary | ICD-10-CM | POA: Diagnosis present

## 2022-10-20 DIAGNOSIS — I951 Orthostatic hypotension: Secondary | ICD-10-CM | POA: Diagnosis present

## 2022-10-20 DIAGNOSIS — R001 Bradycardia, unspecified: Secondary | ICD-10-CM | POA: Diagnosis present

## 2022-10-20 DIAGNOSIS — Z79811 Long term (current) use of aromatase inhibitors: Secondary | ICD-10-CM | POA: Diagnosis not present

## 2022-10-20 DIAGNOSIS — J449 Chronic obstructive pulmonary disease, unspecified: Secondary | ICD-10-CM | POA: Diagnosis present

## 2022-10-20 DIAGNOSIS — R55 Syncope and collapse: Secondary | ICD-10-CM | POA: Diagnosis present

## 2022-10-20 DIAGNOSIS — F32A Depression, unspecified: Secondary | ICD-10-CM | POA: Diagnosis present

## 2022-10-20 DIAGNOSIS — Z923 Personal history of irradiation: Secondary | ICD-10-CM | POA: Diagnosis not present

## 2022-10-20 DIAGNOSIS — Z86006 Personal history of melanoma in-situ: Secondary | ICD-10-CM | POA: Diagnosis not present

## 2022-10-20 DIAGNOSIS — E860 Dehydration: Secondary | ICD-10-CM | POA: Diagnosis present

## 2022-10-20 DIAGNOSIS — Z882 Allergy status to sulfonamides status: Secondary | ICD-10-CM | POA: Diagnosis not present

## 2022-10-20 DIAGNOSIS — I129 Hypertensive chronic kidney disease with stage 1 through stage 4 chronic kidney disease, or unspecified chronic kidney disease: Secondary | ICD-10-CM | POA: Diagnosis present

## 2022-10-20 DIAGNOSIS — K219 Gastro-esophageal reflux disease without esophagitis: Secondary | ICD-10-CM | POA: Diagnosis present

## 2022-10-20 DIAGNOSIS — E890 Postprocedural hypothyroidism: Secondary | ICD-10-CM | POA: Diagnosis present

## 2022-10-20 DIAGNOSIS — N390 Urinary tract infection, site not specified: Secondary | ICD-10-CM | POA: Diagnosis present

## 2022-10-20 DIAGNOSIS — Z803 Family history of malignant neoplasm of breast: Secondary | ICD-10-CM | POA: Diagnosis not present

## 2022-10-20 DIAGNOSIS — Z820 Family history of epilepsy and other diseases of the nervous system: Secondary | ICD-10-CM | POA: Diagnosis not present

## 2022-10-20 DIAGNOSIS — Z79890 Hormone replacement therapy (postmenopausal): Secondary | ICD-10-CM | POA: Diagnosis not present

## 2022-10-20 DIAGNOSIS — Z853 Personal history of malignant neoplasm of breast: Secondary | ICD-10-CM | POA: Diagnosis not present

## 2022-10-20 DIAGNOSIS — Z85828 Personal history of other malignant neoplasm of skin: Secondary | ICD-10-CM | POA: Diagnosis not present

## 2022-10-20 DIAGNOSIS — G35 Multiple sclerosis: Secondary | ICD-10-CM | POA: Diagnosis present

## 2022-10-20 LAB — URINALYSIS, ROUTINE W REFLEX MICROSCOPIC
Bilirubin Urine: NEGATIVE
Glucose, UA: NEGATIVE mg/dL
Ketones, ur: 20 mg/dL — AB
Nitrite: NEGATIVE
Protein, ur: 30 mg/dL — AB
Specific Gravity, Urine: 1.013 (ref 1.005–1.030)
WBC, UA: >50 WBC/hpf (ref 0–5)
pH: 8 (ref 5.0–8.0)

## 2022-10-20 LAB — COMPREHENSIVE METABOLIC PANEL
ALT: 25 U/L (ref 0–44)
AST: 23 U/L (ref 15–41)
Albumin: 3.2 g/dL — ABNORMAL LOW (ref 3.5–5.0)
Alkaline Phosphatase: 63 U/L (ref 38–126)
Anion gap: 9 (ref 5–15)
BUN: 12 mg/dL (ref 8–23)
CO2: 21 mmol/L — ABNORMAL LOW (ref 22–32)
Calcium: 8.4 mg/dL — ABNORMAL LOW (ref 8.9–10.3)
Chloride: 105 mmol/L (ref 98–111)
Creatinine, Ser: 0.92 mg/dL (ref 0.44–1.00)
GFR, Estimated: >60 mL/min (ref >60)
Glucose, Bld: 105 mg/dL — ABNORMAL HIGH (ref 70–99)
Potassium: 3.5 mmol/L (ref 3.5–5.1)
Sodium: 135 mmol/L (ref 135–145)
Total Bilirubin: 0.8 mg/dL (ref 0.3–1.2)
Total Protein: 7 g/dL (ref 6.5–8.1)

## 2022-10-20 LAB — CBC
HCT: 36 % (ref 36.0–46.0)
Hemoglobin: 11.6 g/dL — ABNORMAL LOW (ref 12.0–15.0)
MCH: 30.7 pg (ref 26.0–34.0)
MCHC: 32.2 g/dL (ref 30.0–36.0)
MCV: 95.2 fL (ref 80.0–100.0)
Platelets: 230 10*3/uL (ref 150–400)
RBC: 3.78 MIL/uL — ABNORMAL LOW (ref 3.87–5.11)
RDW: 13.1 % (ref 11.5–15.5)
WBC: 10.6 10*3/uL — ABNORMAL HIGH (ref 4.0–10.5)
nRBC: 0 % (ref 0.0–0.2)

## 2022-10-20 LAB — ECHOCARDIOGRAM COMPLETE
AR max vel: 1.68 cm2
AV Peak grad: 10.8 mm[Hg]
Ao pk vel: 1.65 m/s
Area-P 1/2: 2.8 cm2
Height: 61 in
MV M vel: 3.71 m/s
MV Peak grad: 55.1 mm[Hg]
P 1/2 time: 957 ms
S' Lateral: 2.4 cm
Weight: 2257.51 [oz_av]

## 2022-10-20 LAB — CBG MONITORING, ED
Glucose-Capillary: 96 mg/dL (ref 70–99)
Glucose-Capillary: 123 mg/dL — ABNORMAL HIGH (ref 70–99)
Glucose-Capillary: 133 mg/dL — ABNORMAL HIGH (ref 70–99)

## 2022-10-20 MED ORDER — POTASSIUM CHLORIDE CRYS ER 20 MEQ PO TBCR
40.0000 meq | EXTENDED_RELEASE_TABLET | Freq: Once | ORAL | Status: AC
  Administered 2022-10-20: 40 meq via ORAL
  Filled 2022-10-20: qty 2

## 2022-10-20 MED ORDER — ONDANSETRON HCL 4 MG PO TABS: 4.0000 mg | ORAL_TABLET | Freq: Four times a day (QID) | ORAL | Status: DC | PRN

## 2022-10-20 MED ORDER — SODIUM CHLORIDE 0.9 % IV SOLN
1.0000 g | Freq: Every day | INTRAVENOUS | Status: DC
  Administered 2022-10-20 – 2022-10-21 (×2): 1 g via INTRAVENOUS
  Filled 2022-10-20 (×2): qty 10

## 2022-10-20 MED ORDER — DULOXETINE HCL 30 MG PO CPEP
30.0000 mg | ORAL_CAPSULE | Freq: Every day | ORAL | Status: DC
  Administered 2022-10-20 – 2022-10-21 (×2): 30 mg via ORAL
  Filled 2022-10-20 (×2): qty 1

## 2022-10-20 MED ORDER — MORPHINE SULFATE (PF) 2 MG/ML IV SOLN: 2.0000 mg | INTRAVENOUS | Status: DC | PRN

## 2022-10-20 MED ORDER — PROCHLORPERAZINE EDISYLATE 10 MG/2ML IJ SOLN
5.0000 mg | Freq: Four times a day (QID) | INTRAMUSCULAR | Status: DC | PRN
  Administered 2022-10-20: 5 mg via INTRAVENOUS
  Filled 2022-10-20: qty 2

## 2022-10-20 MED ORDER — PANTOPRAZOLE SODIUM 40 MG PO TBEC
40.0000 mg | DELAYED_RELEASE_TABLET | Freq: Every day | ORAL | Status: DC
  Administered 2022-10-20 – 2022-10-21 (×2): 40 mg via ORAL
  Filled 2022-10-20 (×2): qty 1

## 2022-10-20 MED ORDER — LACTATED RINGERS IV SOLN: INTRAVENOUS | Status: AC

## 2022-10-20 MED ORDER — ONDANSETRON HCL 4 MG/2ML IJ SOLN
4.0000 mg | Freq: Four times a day (QID) | INTRAMUSCULAR | Status: DC | PRN
  Administered 2022-10-20: 4 mg via INTRAVENOUS
  Filled 2022-10-20: qty 2

## 2022-10-20 MED ORDER — LACTATED RINGERS IV SOLN: INTRAVENOUS | Status: DC

## 2022-10-20 MED ORDER — LEVOTHYROXINE SODIUM 88 MCG PO TABS
88.0000 ug | ORAL_TABLET | Freq: Every day | ORAL | Status: DC
  Administered 2022-10-20 – 2022-10-21 (×2): 88 ug via ORAL
  Filled 2022-10-20 (×2): qty 1

## 2022-10-20 MED ORDER — ENOXAPARIN SODIUM 40 MG/0.4ML IJ SOSY
40.0000 mg | PREFILLED_SYRINGE | Freq: Every day | INTRAMUSCULAR | Status: DC
  Administered 2022-10-20 – 2022-10-21 (×2): 40 mg via SUBCUTANEOUS
  Filled 2022-10-20 (×2): qty 0.4

## 2022-10-20 MED ORDER — ALBUTEROL SULFATE (2.5 MG/3ML) 0.083% IN NEBU: 3.0000 mL | INHALATION_SOLUTION | Freq: Four times a day (QID) | RESPIRATORY_TRACT | Status: DC | PRN

## 2022-10-20 MED ORDER — SIMVASTATIN 5 MG PO TABS
5.0000 mg | ORAL_TABLET | Freq: Every evening | ORAL | Status: DC
  Filled 2022-10-20: qty 1

## 2022-10-20 NOTE — Progress Notes (Signed)
PROGRESS NOTE    Christina Cuevas  ZOX:[id number] DOB: 1940/10/16 DOA: 10/19/2022 PCP: Marisue Ivan, MD   Brief Narrative: 82 year old with past medical history significant for left breast cancer treated with radiation, chronic kidney disease stage II, hypertension, hypothyroidism, hyperlipidemia, diabetes type 2 presents with persistent nausea, denies vomiting.  Patient was evaluated in the ED the day prior to admission and was discharged home with prescriptions.  She presented again with nausea.  While in the waiting room patient had a syncopal episode.  She was noted to be bradycardic.   Assessment & Plan:   Principal Problem:   Syncope and collapse Active Problems:   Acquired hypothyroidism   GERD without esophagitis   MS (multiple sclerosis) (HCC)   Primary malignant neoplasm of upper outer quadrant of female breast (HCC)   Pure hypercholesterolemia   Stage 3a chronic kidney disease (HCC)   DM type 2 with diabetic peripheral neuropathy (HCC)   Type 2 diabetes mellitus without complication, without long-term current use of insulin (HCC)  1-Syncope -ECHO -orthostatic.  -Troponin.  -CT Head; No acute intracranial pathology Monitor on telemetry.   Persistent nausea: -LFT normal.  -UA with more than 50 WBC.  -CT abdomen pelvis:  No acute findings within the abdomen or pelvis. No signs of bowel obstruction. Suspect related to UTI.  Symptomatic treatment.   UTI: report dysuria, UA with 50 WBC>  Check urine culture.  Continue with Ceftriaxone.   Diabetes type 2 SSI.  Chronic kidney disease stage IIIa Monitor kidney function.   GERD: Resume PPI.   History of MS: Support care.   Depression:  Resume Cymbalta.   Hypothyroidism: Resume synthroid.   COPD; Resume PRN nebulizer.            Estimated body mass index is 26.66 kg/m as calculated from the following:   Height as of 10/18/22: 5\' 1"  (1.549 m).   Weight as of this encounter: 64 kg.   DVT  prophylaxis: Lovenox Code Status: Full code Family Communication: Disposition Plan:  Status is: Observation The patient remains OBS appropriate and will d/c before 2 midnights. Awaiting urine culture results, monitor on telemetry due to syncope.     Consultants:  none  Procedures:  ECHO  Antimicrobials:    Subjective: She is alert, still having nausea. Report dysuria.    Objective: Vitals:   10/20/22 0230 10/20/22 0315 10/20/22 0445 10/20/22 0530  BP: (!) 158/64 (!) 167/80 (!) 169/72 (!) 152/68  Pulse: (!) 228 (!) 50 (!) 52 (!) 53  Resp: 18 17 13 18   Temp:   97.9 F (36.6 C)   TempSrc:   Oral   SpO2: 100% 96% 98% 95%  Weight:        Intake/Output Summary (Last 24 hours) at 10/20/2022 0747 Last data filed at 10/20/2022 0200 Gross per 24 hour  Intake 963.11 ml  Output --  Net 963.11 ml   Filed Weights   10/19/22 2036  Weight: 64 kg    Examination:  General exam: Appears calm and comfortable  Respiratory system: Clear to auscultation. Respiratory effort normal. Cardiovascular system: S1 & S2 heard, RRR. No JVD, murmurs, rubs, gallops or clicks. No pedal edema. Gastrointestinal system: Abdomen is nondistended, soft and nontender. No organomegaly or masses felt. Normal bowel sounds heard. Central nervous system: Alert and oriented. Extremities: Symmetric 5 x 5 power.   Data Reviewed: I have personally reviewed following labs and imaging studies  CBC: Recent Labs  Lab 10/18/22 1504 10/19/22 2038 10/20/22 0445  WBC  8.5 9.3 10.6*  NEUTROABS  --  5.6  --   HGB 13.0 13.7 11.6*  HCT 39.9 42.0 36.0  MCV 92.6 92.9 95.2  PLT 219 267 230   Basic Metabolic Panel: Recent Labs  Lab 10/18/22 1504 10/19/22 2038 10/20/22 0445  NA 136 134* 135  K 3.9 4.0 3.5  CL 103 99 105  CO2 21* 21* 21*  GLUCOSE 139* 122* 105*  BUN 14 14 12   CREATININE 0.83 1.11* 0.92  CALCIUM 9.2 9.9 8.4*  MG  --  2.3  --    GFR: Estimated Creatinine Clearance: 41.8 mL/min (by C-G  formula based on SCr of 0.92 mg/dL). Liver Function Tests: Recent Labs  Lab 10/18/22 1548 10/19/22 2038 10/20/22 0445  AST 29 31 23   ALT 32 30 25  ALKPHOS 73 76 63  BILITOT 0.9 1.2 0.8  PROT 8.7* 8.6* 7.0  ALBUMIN 4.1 4.2 3.2*   Recent Labs  Lab 10/18/22 1548 10/19/22 2038  LIPASE 26 25   No results for input(s): "AMMONIA" in the last 168 hours. Coagulation Profile: No results for input(s): "INR", "PROTIME" in the last 168 hours. Cardiac Enzymes: No results for input(s): "CKTOTAL", "CKMB", "CKMBINDEX", "TROPONINI" in the last 168 hours. BNP (last 3 results) No results for input(s): "PROBNP" in the last 8760 hours. HbA1C: Recent Labs    10/19/22 2242  HGBA1C 6.7*   CBG: Recent Labs  Lab 10/19/22 2309  GLUCAP 118*   Lipid Profile: No results for input(s): "CHOL", "HDL", "LDLCALC", "TRIG", "CHOLHDL", "LDLDIRECT" in the last 72 hours. Thyroid Function Tests: No results for input(s): "TSH", "T4TOTAL", "FREET4", "T3FREE", "THYROIDAB" in the last 72 hours. Anemia Panel: No results for input(s): "VITAMINB12", "FOLATE", "FERRITIN", "TIBC", "IRON", "RETICCTPCT" in the last 72 hours. Sepsis Labs: No results for input(s): "PROCALCITON", "LATICACIDVEN" in the last 168 hours.  No results found for this or any previous visit (from the past 240 hour(s)).       Radiology Studies: CT Head Wo Contrast  Result Date: 10/19/2022 CLINICAL DATA:  Syncope. EXAM: CT HEAD WITHOUT CONTRAST TECHNIQUE: Contiguous axial images were obtained from the base of the skull through the vertex without intravenous contrast. RADIATION DOSE REDUCTION: This exam was performed according to the departmental dose-optimization program which includes automated exposure control, adjustment of the mA and/or kV according to patient size and/or use of iterative reconstruction technique. COMPARISON:  Head CT dated 05/23/2022. FINDINGS: Brain: Mild age-related atrophy and chronic microvascular ischemic changes.  There is no acute intracranial hemorrhage. No mass effect or midline shift. No extra-axial fluid collection. Vascular: No hyperdense vessel or unexpected calcification. Skull: Normal. Negative for fracture or focal lesion. Sinuses/Orbits: No acute finding. Other: None IMPRESSION: 1. No acute intracranial pathology. 2. Mild age-related atrophy and chronic microvascular ischemic changes. Electronically Signed   By: Elgie Collard M.D.   On: 10/19/2022 21:48   DG Chest Port 1 View  Result Date: 10/19/2022 CLINICAL DATA:  Syncope. EXAM: PORTABLE CHEST 1 VIEW COMPARISON:  Chest radiograph dated 10/18/2022. FINDINGS: There is diffuse chronic interstitial coarsening and bibasilar scarring. No focal consolidation, pleural effusion, or pneumothorax. Stable cardiac silhouette. Atherosclerotic calcification of the aorta. Degenerative changes of spine. No acute osseous pathology. IMPRESSION: No active disease. Electronically Signed   By: Elgie Collard M.D.   On: 10/19/2022 21:31   CT ABDOMEN PELVIS W CONTRAST  Result Date: 10/18/2022 CLINICAL DATA:  Bowel obstruction suspected. Patient presents from home with nausea. EXAM: CT ABDOMEN AND PELVIS WITH CONTRAST TECHNIQUE: Multidetector CT imaging  of the abdomen and pelvis was performed using the standard protocol following bolus administration of intravenous contrast. RADIATION DOSE REDUCTION: This exam was performed according to the departmental dose-optimization program which includes automated exposure control, adjustment of the mA and/or kV according to patient size and/or use of iterative reconstruction technique. CONTRAST:  75mL OMNIPAQUE IOHEXOL 300 MG/ML  SOLN COMPARISON:  07/08/2018 FINDINGS: Lower chest: No acute abnormality. Chronic interstitial changes with subpleural and parenchymal interstitial reticulation. Nodule within the lateral left lung base measures 7 mm, image 22/4. This is unchanged from 10/11/2020. Hepatobiliary: No focal liver abnormality is  seen. No gallstones, gallbladder wall thickening, or biliary dilatation. Pancreas: No inflammation or mass identified. Measures 5 mm and is unchanged from 07/08/2018. Spleen: Normal in size without focal abnormality. Adrenals/Urinary Tract: Normal adrenal glands. Punctate stone noted within the upper pole of the right kidney, image 28/2. No kidney mass, or hydronephrosis identified. Urinary bladder is unremarkable. Stomach/Bowel: The stomach appears unremarkable. Status post appendectomy. No pathologic dilatation of the large or small bowel loops to suggest an obstruction. Colonic diverticulosis identified. No signs of acute diverticulitis. Vascular/Lymphatic: Aortic atherosclerosis. The upper abdominal vascularity is patent. No signs of abdominopelvic adenopathy. Reproductive: Pessary device is identified. Uterus and adnexal structures are unremarkable. Other: No free air.  There is no free fluid or fluid collections. Musculoskeletal: No acute or significant osseous findings. IMPRESSION: 1. No acute findings within the abdomen or pelvis. No signs of bowel obstruction. 2. Colonic diverticulosis without signs of acute diverticulitis. 3. Punctate nonobstructing right renal calculus. 4. Chronic interstitial changes within the lung bases. 5. Nodule within the lateral left lung base measures 7 mm. This is unchanged when compared with 10/12/2022. Findings compatible with a benign nodule requiring no further follow-up. 6.  Aortic Atherosclerosis (ICD10-I70.0). Electronically Signed   By: Signa Kell M.D.   On: 10/18/2022 18:25   DG Chest 2 View  Result Date: 10/18/2022 CLINICAL DATA:  Nausea.  Heartburn.  Shortness of breath. EXAM: CHEST - 2 VIEW COMPARISON:  09/23/2020 FINDINGS: Patient rotated right. Midline trachea. Mild cardiomegaly. Atherosclerosis in the transverse aorta. Mild right hemidiaphragm elevation. No pleural effusion or pneumothorax. Similar lower lung and peripheral predominant interstitial  coarsening which when correlated with the 10/11/2020 CT is likely combination of emphysema and postinfectious/inflammatory fibrosis versus concurrent interstitial lung disease. No lobar consolidation. IMPRESSION: Chronic interstitial coarsening as detailed above. No superimposed acute process. Aortic Atherosclerosis (ICD10-I70.0). Electronically Signed   By: Jeronimo Greaves M.D.   On: 10/18/2022 16:34        Scheduled Meds:  enoxaparin (LOVENOX) injection  40 mg Subcutaneous Daily   insulin aspart  0-5 Units Subcutaneous QHS   insulin aspart  0-9 Units Subcutaneous TID WC   levothyroxine  88 mcg Oral QAC breakfast   Continuous Infusions:  lactated ringers 125 mL/hr at 10/20/22 0204     LOS: 0 days    Time spent: 35 minutes.     Alba Cory, MD [facility]   If 7PM-7AM, please contact night-coverage [email or web address]  10/20/2022, 7:47 AM

## 2022-10-20 NOTE — Progress Notes (Signed)
Echocardiogram 2D Echocardiogram has been performed.  Lucendia Herrlich 10/20/2022, 4:02 PM

## 2022-10-20 NOTE — ED Notes (Signed)
Patient assisted to use bedside commode at this time.  No reports of dizziness.

## 2022-10-21 DIAGNOSIS — R55 Syncope and collapse: Secondary | ICD-10-CM | POA: Diagnosis not present

## 2022-10-21 LAB — BASIC METABOLIC PANEL
Anion gap: 9 (ref 5–15)
BUN: 13 mg/dL (ref 8–23)
CO2: 25 mmol/L (ref 22–32)
Calcium: 9.1 mg/dL (ref 8.9–10.3)
Chloride: 102 mmol/L (ref 98–111)
Creatinine, Ser: 1.03 mg/dL — ABNORMAL HIGH (ref 0.44–1.00)
GFR, Estimated: 55 mL/min — ABNORMAL LOW (ref >60)
Glucose, Bld: 114 mg/dL — ABNORMAL HIGH (ref 70–99)
Potassium: 4.4 mmol/L (ref 3.5–5.1)
Sodium: 136 mmol/L (ref 135–145)

## 2022-10-21 LAB — CBC WITH DIFFERENTIAL/PLATELET
Abs Immature Granulocytes: 0.03 10*3/uL (ref 0.00–0.07)
Basophils Absolute: 0 10*3/uL (ref 0.0–0.1)
Basophils Relative: 0 %
Eosinophils Absolute: 0.3 10*3/uL (ref 0.0–0.5)
Eosinophils Relative: 2 %
HCT: 38.9 % (ref 36.0–46.0)
Hemoglobin: 12.4 g/dL (ref 12.0–15.0)
Immature Granulocytes: 0 %
Lymphocytes Relative: 21 %
Lymphs Abs: 2.2 10*3/uL (ref 0.7–4.0)
MCH: 30 pg (ref 26.0–34.0)
MCHC: 31.9 g/dL (ref 30.0–36.0)
MCV: 94.2 fL (ref 80.0–100.0)
Monocytes Absolute: 0.8 10*3/uL (ref 0.1–1.0)
Monocytes Relative: 8 %
Neutro Abs: 7.2 10*3/uL (ref 1.7–7.7)
Neutrophils Relative %: 69 %
Platelets: 223 10*3/uL (ref 150–400)
RBC: 4.13 MIL/uL (ref 3.87–5.11)
RDW: 13.1 % (ref 11.5–15.5)
WBC: 10.5 10*3/uL (ref 4.0–10.5)
nRBC: 0 % (ref 0.0–0.2)

## 2022-10-21 LAB — URINE CULTURE

## 2022-10-21 LAB — CBG MONITORING, ED: Glucose-Capillary: 122 mg/dL — ABNORMAL HIGH (ref 70–99)

## 2022-10-21 MED ORDER — CEFADROXIL 500 MG PO CAPS: 500.0000 mg | ORAL_CAPSULE | Freq: Every day | ORAL | 0 refills | Status: AC

## 2022-10-21 NOTE — ED Notes (Signed)
ED TO INPATIENT HANDOFF REPORT  ED Nurse Name and Phone #: Koden Hunzeker [phone]  S Name/Age/Gender Christina Cuevas 82 y.o. female Room/Bed: 042C/042C  Code Status   Code Status: Full Code  Home/SNF/Other Home Patient oriented to: self, place, time, and situation Is this baseline? Yes   Triage Complete: Triage complete  Chief Complaint Syncope and collapse [R55]  Triage Note Patient POV from home.  Patient seen yesterday at [facility] for abdominal pain, n/v.  Patient feeling worse today, had syncopal episode while waiting at check in.  Patient disoriented upon assessment.    Allergies Allergies  Allergen Reactions   Floxacillin (Flucloxacillin)    Cephalexin Nausea Only   Ciprofloxacin Nausea Only   Metronidazole Nausea Only    Level of Care/Admitting Diagnosis ED Disposition     ED Disposition  Admit   Condition  --   Comment  Hospital Area: [facility] [[id number]]  Level of Care: Telemetry Cardiac [[id number]]  May admit patient to [facility] or [facility] if equivalent level of care is available:: No  Covid Evaluation: Asymptomatic - no recent exposure (last 10 days) testing not required  Diagnosis: Syncope and collapse [780.2.ICD-9-CM]  Admitting Physician: Rometta Emery [[id number]]  Attending Physician: Alba Cory [id number]  Certification:: I certify this patient will need inpatient services for at least 2 midnights  Expected Medical Readiness: 10/21/2022          B Medical/Surgery History Past Medical History:  Diagnosis Date   Actinic keratosis    Basal cell carcinoma 05/18/2006   Left pretibia   Breast cancer (HCC) 2011   left breast cancer treated with radiation   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 Slight to moderate atypia   Dysplastic nevus 01/18/2007   Right sup lat thigh. Moderate atypia, extends to one edge.   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 Excised 01/10/2007, residual MIS, edges free   Pre-diabetes    diet controlled   Type 2 diabetes mellitus without complication, without long-term current use of insulin (HCC) 12/20/2017   Past Surgical History:  Procedure Laterality Date   APPENDECTOMY  1950   BREAST LUMPECTOMY Left 02/2009   lymph nodes removed   BREAST SURGERY Right 02/2009   sclerosing lesion removed at same time   BUNIONECTOMY Left 03/25/2018   Procedure: DOUBLE OSTEOTOMY;  Surgeon: Gwyneth Revels, DPM;  Location: [facility] ORS;  Service: Podiatry;  Laterality: Left;   COLONOSCOPY       A IV Location/Drains/Wounds Patient Lines/Drains/Airways Status     Active Line/Drains/Airways     Name Placement date Placement time Site Days   Peripheral IV 10/19/22 20 G Right Antecubital 10/19/22  2038  Antecubital  2            Intake/Output Last 24 hours  Intake/Output Summary (Last 24 hours) at 10/21/2022 0843 Last data filed at 10/20/2022 1743 Gross per 24 hour  Intake 516.67 ml  Output --  Net 516.67 ml    Labs/Imaging Results for orders placed or performed during the hospital encounter of 10/19/22 (from the past 48 hour(s))  Troponin I (High Sensitivity)     Status: None   Collection Time: 10/19/22  8:38 PM  Result Value Ref Range   Troponin  I (High Sensitivity) 6 <18 ng/L    Comment: (NOTE) Elevated high sensitivity troponin I (hsTnI) values and significant  changes across serial measurements may suggest ACS but many other  chronic and acute conditions are known to elevate hsTnI results.  Refer to the "Links" section for chest pain algorithms and additional  guidance. Performed at [facility], [address] [place], [place] [address]   CBC with Differential     Status: None   Collection Time: 10/19/22  8:38 PM  Result Value Ref Range   WBC 9.3 4.0 - 10.5 K/uL   RBC 4.52 3.87 - 5.11  MIL/uL   Hemoglobin 13.7 12.0 - 15.0 g/dL   HCT 44.0 34.7 - 42.5 %   MCV 92.9 80.0 - 100.0 fL   MCH 30.3 26.0 - 34.0 pg   MCHC 32.6 30.0 - 36.0 g/dL   RDW 95.6 38.7 - 56.4 %   Platelets 267 150 - 400 K/uL   nRBC 0.0 0.0 - 0.2 %   Neutrophils Relative % 61 %   Neutro Abs 5.6 1.7 - 7.7 K/uL   Lymphocytes Relative 31 %   Lymphs Abs 2.9 0.7 - 4.0 K/uL   Monocytes Relative 6 %   Monocytes Absolute 0.6 0.1 - 1.0 K/uL   Eosinophils Relative 1 %   Eosinophils Absolute 0.1 0.0 - 0.5 K/uL   Basophils Relative 1 %   Basophils Absolute 0.1 0.0 - 0.1 K/uL   Immature Granulocytes 0 %   Abs Immature Granulocytes 0.03 0.00 - 0.07 K/uL    Comment: Performed at [facility], [address], [place], [place] [address]  Lipase, blood     Status: None   Collection Time: 10/19/22  8:38 PM  Result Value Ref Range   Lipase 25 11 - 51 U/L    Comment: Performed at [facility], [address], [place], [place] [address]  Comprehensive metabolic panel     Status: Abnormal   Collection Time: 10/19/22  8:38 PM  Result Value Ref Range   Sodium 134 (L) 135 - 145 mmol/L   Potassium 4.0 3.5 - 5.1 mmol/L   Chloride 99 98 - 111 mmol/L   CO2 21 (L) 22 - 32 mmol/L   Glucose, Bld 122 (H) 70 - 99 mg/dL    Comment: Glucose reference range applies only to samples taken after fasting for at least 8 hours.   BUN 14 8 - 23 mg/dL   Creatinine, Ser 6.60 (H) 0.44 - 1.00 mg/dL   Calcium 9.9 8.9 - 63.0 mg/dL   Total Protein 8.6 (H) 6.5 - 8.1 g/dL   Albumin 4.2 3.5 - 5.0 g/dL   AST 31 15 - 41 U/L   ALT 30 0 - 44 U/L   Alkaline Phosphatase 76 38 - 126 U/L   Total Bilirubin 1.2 0.3 - 1.2 mg/dL   GFR, Estimated 50 (L) >60 mL/min    Comment: (NOTE) Calculated using the CKD-EPI Creatinine Equation (2021)    Anion gap 14 5 - 15    Comment: Performed at [facility], [address] [place], [place] [address]  Magnesium     Status: None   Collection Time: 10/19/22  8:38 PM  Result Value Ref Range    Magnesium 2.3 1.7 - 2.4 mg/dL    Comment: Performed at [facility], [address], [place], [place] [address]  Troponin I (High Sensitivity)     Status: None  Collection Time: 10/19/22 10:38 PM  Result Value Ref Range   Troponin I (High Sensitivity) 6 <18 ng/L    Comment: (NOTE) Elevated high sensitivity troponin I (hsTnI) values and significant  changes across serial measurements may suggest ACS but many other  chronic and acute conditions are known to elevate hsTnI results.  Refer to the "Links" section for chest pain algorithms and additional  guidance. Performed at [facility], [address], [place], [place] [address]   Hemoglobin A1c     Status: Abnormal   Collection Time: 10/19/22 10:42 PM  Result Value Ref Range   Hgb A1c MFr Bld 6.7 (H) 4.8 - 5.6 %    Comment: (NOTE) Pre diabetes:          5.7%-6.4%  Diabetes:              >6.4%  Glycemic control for   <7.0% adults with diabetes    Mean Plasma Glucose 145.59 mg/dL    Comment: Performed at [facility], [address], [place], [place] [address]  CBG monitoring, ED     Status: Abnormal   Collection Time: 10/19/22 11:09 PM  Result Value Ref Range   Glucose-Capillary 118 (H) 70 - 99 mg/dL    Comment: Glucose reference range applies only to samples taken after fasting for at least 8 hours.  Urinalysis, Routine w reflex microscopic -Urine, Clean Catch     Status: Abnormal   Collection Time: 10/19/22 11:28 PM  Result Value Ref Range   Color, Urine AMBER (A) YELLOW    Comment: BIOCHEMICALS MAY BE AFFECTED BY COLOR   APPearance CLOUDY (A) CLEAR   Specific Gravity, Urine 1.013 1.005 - 1.030   pH 8.0 5.0 - 8.0   Glucose, UA NEGATIVE NEGATIVE mg/dL   Hgb urine dipstick SMALL (A) NEGATIVE   Bilirubin Urine NEGATIVE NEGATIVE   Ketones, ur 20 (A) NEGATIVE mg/dL   Protein, ur 30 (A) NEGATIVE mg/dL   Nitrite NEGATIVE NEGATIVE   Leukocytes,Ua LARGE (A) NEGATIVE   RBC / HPF 6-10 0 - 5 RBC/hpf   WBC, UA  >50 0 - 5 WBC/hpf   Bacteria, UA FEW (A) NONE SEEN   Squamous Epithelial / HPF 0-5 0 - 5 /HPF   WBC Clumps PRESENT    Mucus PRESENT     Comment: Performed at [facility], [address], [place], [place] [address]  Comprehensive metabolic panel     Status: Abnormal   Collection Time: 10/20/22  4:45 AM  Result Value Ref Range   Sodium 135 135 - 145 mmol/L   Potassium 3.5 3.5 - 5.1 mmol/L   Chloride 105 98 - 111 mmol/L   CO2 21 (L) 22 - 32 mmol/L   Glucose, Bld 105 (H) 70 - 99 mg/dL    Comment: Glucose reference range applies only to samples taken after fasting for at least 8 hours.   BUN 12 8 - 23 mg/dL   Creatinine, Ser 9.38 0.44 - 1.00 mg/dL   Calcium 8.4 (L) 8.9 - 10.3 mg/dL   Total Protein 7.0 6.5 - 8.1 g/dL   Albumin 3.2 (L) 3.5 - 5.0 g/dL   AST 23 15 - 41 U/L   ALT 25 0 - 44 U/L   Alkaline Phosphatase 63 38 - 126 U/L   Total Bilirubin 0.8 0.3 - 1.2 mg/dL   GFR, Estimated >10 >17 mL/min    Comment: (NOTE) Calculated using the CKD-EPI Creatinine Equation (2021)  Anion gap 9 5 - 15    Comment: Performed at [facility], [address], [place], [place] [address]  CBC     Status: Abnormal   Collection Time: 10/20/22  4:45 AM  Result Value Ref Range   WBC 10.6 (H) 4.0 - 10.5 K/uL   RBC 3.78 (L) 3.87 - 5.11 MIL/uL   Hemoglobin 11.6 (L) 12.0 - 15.0 g/dL   HCT 21.3 08.6 - 57.8 %   MCV 95.2 80.0 - 100.0 fL   MCH 30.7 26.0 - 34.0 pg   MCHC 32.2 30.0 - 36.0 g/dL   RDW 46.9 62.9 - 52.8 %   Platelets 230 150 - 400 K/uL   nRBC 0.0 0.0 - 0.2 %    Comment: Performed at [facility], [address], [place], [place] [address]  CBG monitoring, ED     Status: None   Collection Time: 10/20/22  7:46 AM  Result Value Ref Range   Glucose-Capillary 96 70 - 99 mg/dL    Comment: Glucose reference range applies only to samples taken after fasting for at least 8 hours.   Comment 1 Notify RN    Comment 2 Document in Chart   Urine Culture (for pregnant, neutropenic or  urologic patients or patients with an indwelling urinary catheter)     Status: Abnormal   Collection Time: 10/20/22  7:56 AM   Specimen: Urine, Clean Catch  Result Value Ref Range   Specimen Description URINE, CLEAN CATCH    Special Requests      NONE Performed at [facility], [address] [place], [place] [address]    Culture MULTIPLE SPECIES PRESENT, SUGGEST RECOLLECTION (A)    Report Status 10/21/2022 FINAL   CBG monitoring, ED     Status: Abnormal   Collection Time: 10/20/22  2:56 PM  Result Value Ref Range   Glucose-Capillary 123 (H) 70 - 99 mg/dL    Comment: Glucose reference range applies only to samples taken after fasting for at least 8 hours.   Comment 1 Notify RN    Comment 2 Document in Chart   CBG monitoring, ED     Status: Abnormal   Collection Time: 10/20/22  9:15 PM  Result Value Ref Range   Glucose-Capillary 133 (H) 70 - 99 mg/dL    Comment: Glucose reference range applies only to samples taken after fasting for at least 8 hours.  CBG monitoring, ED     Status: Abnormal   Collection Time: 10/21/22  7:27 AM  Result Value Ref Range   Glucose-Capillary 122 (H) 70 - 99 mg/dL    Comment: Glucose reference range applies only to samples taken after fasting for at least 8 hours.  CBC with Differential/Platelet     Status: None   Collection Time: 10/21/22  7:48 AM  Result Value Ref Range   WBC 10.5 4.0 - 10.5 K/uL   RBC 4.13 3.87 - 5.11 MIL/uL   Hemoglobin 12.4 12.0 - 15.0 g/dL   HCT 72.5 36.6 - 44.0 %   MCV 94.2 80.0 - 100.0 fL   MCH 30.0 26.0 - 34.0 pg   MCHC 31.9 30.0 - 36.0 g/dL   RDW 34.7 42.5 - 95.6 %   Platelets 223 150 - 400 K/uL   nRBC 0.0 0.0 - 0.2 %   Neutrophils Relative % 69 %   Neutro Abs 7.2 1.7 - 7.7 K/uL   Lymphocytes Relative 21 %   Lymphs Abs 2.2 0.7 -  4.0 K/uL   Monocytes Relative 8 %   Monocytes Absolute 0.8 0.1 - 1.0 K/uL   Eosinophils Relative 2 %   Eosinophils Absolute 0.3 0.0 - 0.5 K/uL   Basophils Relative 0 %   Basophils  Absolute 0.0 0.0 - 0.1 K/uL   Immature Granulocytes 0 %   Abs Immature Granulocytes 0.03 0.00 - 0.07 K/uL    Comment: Performed at [facility], [address] [place], [place] [address]   ECHOCARDIOGRAM COMPLETE  Result Date: 10/20/2022    ECHOCARDIOGRAM REPORT   Patient Name:   DALIN KEATH Date of Exam: 10/20/2022 Medical Rec #:  [id number]    Height:       61.0 in Accession #:    [id number]   Weight:       141.1 lb Date of Birth:  1940-06-22    BSA:          1.628 m Patient Age:    81 years     BP:           166/78 mmHg Patient Gender: F            HR:           55 bpm. Exam Location:  Inpatient Procedure: 2D Echo, Cardiac Doppler and Color Doppler Indications:    Syncope R55  History:        Patient has no prior history of Echocardiogram examinations.                 Signs/Symptoms:Syncope; Risk Factors:Diabetes. CKD, stage 3.  Sonographer:    Lucendia Herrlich Referring Phys: [id number] MOHAMMAD L GARBA IMPRESSIONS  1. Left ventricular ejection fraction, by estimation, is 55 to 60%. The left ventricle has normal function. The left ventricle has no regional wall motion abnormalities. There is mild asymmetric left ventricular hypertrophy of the septal segment. Left ventricular diastolic parameters are consistent with Grade I diastolic dysfunction (impaired relaxation).  2. Right ventricular systolic function is normal. The right ventricular size is normal.  3. The mitral valve is normal in structure. No evidence of mitral valve regurgitation. No evidence of mitral stenosis.  4. The aortic valve is normal in structure. Aortic valve regurgitation is mild to moderate. Aortic valve sclerosis/calcification is present, without any evidence of aortic stenosis.  5. The inferior vena cava is normal in size with greater than 50% respiratory variability, suggesting right atrial pressure of 3 mmHg. FINDINGS  Left Ventricle: Left ventricular ejection fraction, by estimation, is 55 to 60%. The left ventricle has normal  function. The left ventricle has no regional wall motion abnormalities. The left ventricular internal cavity size was normal in size. There is  mild asymmetric left ventricular hypertrophy of the septal segment. Left ventricular diastolic parameters are consistent with Grade I diastolic dysfunction (impaired relaxation). Right Ventricle: The right ventricular size is normal. No increase in right ventricular wall thickness. Right ventricular systolic function is normal. Left Atrium: Left atrial size was normal in size. Right Atrium: Right atrial size was normal in size. Pericardium: There is no evidence of pericardial effusion. Mitral Valve: The mitral valve is normal in structure. No evidence of mitral valve regurgitation. No evidence of mitral valve stenosis. Tricuspid Valve: The tricuspid valve is normal in structure. Tricuspid valve regurgitation is trivial. No evidence of tricuspid stenosis. Aortic Valve: The aortic valve is normal in structure. Aortic valve regurgitation is mild to moderate. Aortic regurgitation PHT measures 957 msec. Aortic valve sclerosis/calcification is present, without any evidence of  aortic stenosis. Aortic valve peak  gradient measures 10.8 mmHg. Pulmonic Valve: The pulmonic valve was normal in structure. Pulmonic valve regurgitation is not visualized. No evidence of pulmonic stenosis. Aorta: The aortic root is normal in size and structure. Venous: The inferior vena cava is normal in size with greater than 50% respiratory variability, suggesting right atrial pressure of 3 mmHg. IAS/Shunts: No atrial level shunt detected by color flow Doppler.  LEFT VENTRICLE PLAX 2D LVIDd:         3.70 cm   Diastology LVIDs:         2.40 cm   LV e' medial:    4.46 cm/s LV PW:         0.90 cm   LV E/e' medial:  13.9 LV IVS:        1.10 cm   LV e' lateral:   7.94 cm/s LVOT diam:     1.80 cm   LV E/e' lateral: 7.8 LV SV:         58 LV SV Index:   36 LVOT Area:     2.54 cm  RIGHT VENTRICLE             IVC  RV S prime:     13.70 cm/s  IVC diam: 1.40 cm TAPSE (M-mode): 1.7 cm LEFT ATRIUM             Index        RIGHT ATRIUM          Index LA diam:        3.20 cm 1.97 cm/m   RA Area:     9.67 cm LA Vol (A2C):   40.9 ml 25.12 ml/m  RA Volume:   18.60 ml 11.42 ml/m LA Vol (A4C):   44.7 ml 27.45 ml/m LA Biplane Vol: 47.0 ml 28.86 ml/m  AORTIC VALVE AV Area (Vmax): 1.68 cm AV Vmax:        164.50 cm/s AV Peak Grad:   10.8 mmHg LVOT Vmax:      108.33 cm/s LVOT Vmean:     67.867 cm/s LVOT VTI:       0.229 m AI PHT:         957 msec  AORTA Ao Root diam: 2.70 cm Ao Asc diam:  3.00 cm MITRAL VALVE               TRICUSPID VALVE MV Area (PHT): 2.80 cm    TR Peak grad:   23.8 mmHg MV Decel Time: 271 msec    TR Vmax:        244.00 cm/s MR Peak grad: 55.1 mmHg MR Vmax:      371.00 cm/s  SHUNTS MV E velocity: 62.00 cm/s  Systemic VTI:  0.23 m MV A velocity: 99.10 cm/s  Systemic Diam: 1.80 cm MV E/A ratio:  0.63 Kardie Tobb DO Electronically signed by Thomasene Ripple DO Signature Date/Time: 10/20/2022/4:29:17 PM    Final    CT Head Wo Contrast  Result Date: 10/19/2022 CLINICAL DATA:  Syncope. EXAM: CT HEAD WITHOUT CONTRAST TECHNIQUE: Contiguous axial images were obtained from the base of the skull through the vertex without intravenous contrast. RADIATION DOSE REDUCTION: This exam was performed according to the departmental dose-optimization program which includes automated exposure control, adjustment of the mA and/or kV according to patient size and/or use of iterative reconstruction technique. COMPARISON:  Head CT dated 05/23/2022. FINDINGS: Brain: Mild age-related atrophy and chronic microvascular ischemic changes. There is no acute intracranial hemorrhage. No  mass effect or midline shift. No extra-axial fluid collection. Vascular: No hyperdense vessel or unexpected calcification. Skull: Normal. Negative for fracture or focal lesion. Sinuses/Orbits: No acute finding. Other: None IMPRESSION: 1. No acute intracranial pathology.  2. Mild age-related atrophy and chronic microvascular ischemic changes. Electronically Signed   By: Elgie Collard M.D.   On: 10/19/2022 21:48   DG Chest Port 1 View  Result Date: 10/19/2022 CLINICAL DATA:  Syncope. EXAM: PORTABLE CHEST 1 VIEW COMPARISON:  Chest radiograph dated 10/18/2022. FINDINGS: There is diffuse chronic interstitial coarsening and bibasilar scarring. No focal consolidation, pleural effusion, or pneumothorax. Stable cardiac silhouette. Atherosclerotic calcification of the aorta. Degenerative changes of spine. No acute osseous pathology. IMPRESSION: No active disease. Electronically Signed   By: Elgie Collard M.D.   On: 10/19/2022 21:31    Pending Labs Unresulted Labs (From admission, onward)     Start     Ordered   10/26/22 0500  Creatinine, serum  (enoxaparin (LOVENOX)    CrCl >/= 30 ml/min)  Weekly,   R     Comments: while on enoxaparin therapy    10/20/22 0140   10/21/22 0729  CBC with Differential/Platelet  Daily,   R      10/21/22 0728   10/21/22 0729  Basic metabolic panel  Daily,   R      10/21/22 0728   10/20/22 1750  C Difficile Quick Screen w PCR reflex  (C Difficile quick screen w PCR reflex panel )  Once, for 24 hours,   TIMED       References:    CDiff Information Tool   10/20/22 1749            Vitals/Pain Today's Vitals   10/21/22 0600 10/21/22 0615 10/21/22 0630 10/21/22 0645  BP: 101/68 (!) 128/114 (!) 151/71 (!) 157/70  Pulse: 73 (!) 38    Resp: 17     Temp: 98.3 F (36.8 C)     TempSrc: Oral     SpO2: 100%     Weight:      Height:      PainSc:        Isolation Precautions Enteric precautions (UV disinfection)  Medications Medications  levothyroxine (SYNTHROID) tablet 88 mcg (88 mcg Oral Given 10/21/22 0804)  enoxaparin (LOVENOX) injection 40 mg (40 mg Subcutaneous Given 10/20/22 1011)  ondansetron (ZOFRAN) tablet 4 mg ( Oral See Alternative 10/20/22 0820)    Or  ondansetron (ZOFRAN) injection 4 mg (4 mg Intravenous Given  10/20/22 0820)  morphine (PF) 2 MG/ML injection 2 mg (has no administration in time range)  insulin aspart (novoLOG) injection 0-9 Units (1 Units Subcutaneous Given 10/21/22 0758)  insulin aspart (novoLOG) injection 0-5 Units ( Subcutaneous Not Given 10/20/22 2115)  lactated ringers infusion (0 mLs Intravenous Stopped 10/20/22 1743)  cefTRIAXone (ROCEPHIN) 1 g in sodium chloride 0.9 % 100 mL IVPB (0 g Intravenous Stopped 10/20/22 0927)  DULoxetine (CYMBALTA) DR capsule 30 mg (30 mg Oral Given 10/20/22 1011)  pantoprazole (PROTONIX) EC tablet 40 mg (40 mg Oral Given 10/20/22 1011)  simvastatin (ZOCOR) tablet 5 mg (5 mg Oral Not Given 10/20/22 1800)  albuterol (PROVENTIL) (2.5 MG/3ML) 0.083% nebulizer solution 3 mL (has no administration in time range)  prochlorperazine (COMPAZINE) injection 5 mg (5 mg Intravenous Given 10/20/22 1146)  sodium chloride 0.9 % bolus 500 mL (0 mLs Intravenous Stopped 10/19/22 2216)  ondansetron (ZOFRAN) injection 4 mg (4 mg Intravenous Given 10/19/22 2135)  0.9 %  sodium chloride infusion (0  mLs Intravenous Stopped 10/20/22 0200)  potassium chloride SA (KLOR-CON M) CR tablet 40 mEq (40 mEq Oral Given 10/20/22 0841)    Mobility walks     Focused Assessments GI and Cardiac    R Recommendations: See Admitting Provider Note  Report given to:   Additional Notes:

## 2022-10-22 NOTE — Discharge Summary (Signed)
4 mg total) by mouth every 6 (six) hours as needed for nausea or vomiting.   pantoprazole 40 MG tablet Commonly known as: PROTONIX Take 40 mg by mouth daily.   simethicone 80 MG chewable tablet Commonly known as: MYLICON Chew 80 mg by mouth daily.        Follow-up Information     Marisue Ivan, MD. Schedule an appointment as soon as possible for a visit in 1 week(s).    Specialty: Family Medicine Contact information: [address] [facility] [place] [address] [phone]                Discharge Exam: Ceasar Mons Weights   10/19/22 2036 10/20/22 1342  Weight: 64 kg 64 kg   General: NAD  Cardiovascular: S1, S2 present Respiratory: CTAB Abdomen: Soft, nontender, nondistended, bowel sounds present Musculoskeletal: No bilateral pedal edema noted Skin: Normal Psychiatry: Normal mood   Condition at discharge: stable  The results of significant diagnostics from this hospitalization (including imaging, microbiology, ancillary and laboratory) are listed below for reference.   Imaging Studies: ECHOCARDIOGRAM COMPLETE  Result Date: 10/20/2022    ECHOCARDIOGRAM REPORT   Patient Name:   Christina Cuevas Date of Exam: 10/20/2022 Medical Rec #:  [id number]    Height:       61.0 in Accession #:    [id number]   Weight:       141.1 lb Date of Birth:  22-Jan-1940    BSA:          1.628 m Patient Age:    81 years     BP:           166/78 mmHg Patient Gender: F            HR:           55 bpm. Exam Location:  Inpatient Procedure: 2D Echo, Cardiac Doppler and Color Doppler Indications:    Syncope R55  History:        Patient has no prior history of Echocardiogram examinations.                 Signs/Symptoms:Syncope; Risk Factors:Diabetes. CKD, stage 3.  Sonographer:    Lucendia Herrlich Referring Phys: [id number] MOHAMMAD L GARBA IMPRESSIONS  1. Left ventricular ejection fraction, by estimation, is 55 to 60%. The left ventricle has normal function. The left ventricle has no regional wall motion abnormalities. There is mild asymmetric left ventricular hypertrophy of the septal segment. Left ventricular diastolic parameters are consistent with Grade I diastolic dysfunction (impaired relaxation).  2. Right ventricular systolic function is normal. The right ventricular size is normal.  3. The mitral valve is normal in structure. No evidence of mitral valve  regurgitation. No evidence of mitral stenosis.  4. The aortic valve is normal in structure. Aortic valve regurgitation is mild to moderate. Aortic valve sclerosis/calcification is present, without any evidence of aortic stenosis.  5. The inferior vena cava is normal in size with greater than 50% respiratory variability, suggesting right atrial pressure of 3 mmHg. FINDINGS  Left Ventricle: Left ventricular ejection fraction, by estimation, is 55 to 60%. The left ventricle has normal function. The left ventricle has no regional wall motion abnormalities. The left ventricular internal cavity size was normal in size. There is  mild asymmetric left ventricular hypertrophy of the septal segment. Left ventricular diastolic parameters are consistent with Grade I diastolic dysfunction (impaired relaxation). Right Ventricle: The right ventricular size is normal. No increase in right ventricular wall thickness.  4 mg total) by mouth every 6 (six) hours as needed for nausea or vomiting.   pantoprazole 40 MG tablet Commonly known as: PROTONIX Take 40 mg by mouth daily.   simethicone 80 MG chewable tablet Commonly known as: MYLICON Chew 80 mg by mouth daily.        Follow-up Information     Marisue Ivan, MD. Schedule an appointment as soon as possible for a visit in 1 week(s).    Specialty: Family Medicine Contact information: [address] [facility] [place] [address] [phone]                Discharge Exam: Ceasar Mons Weights   10/19/22 2036 10/20/22 1342  Weight: 64 kg 64 kg   General: NAD  Cardiovascular: S1, S2 present Respiratory: CTAB Abdomen: Soft, nontender, nondistended, bowel sounds present Musculoskeletal: No bilateral pedal edema noted Skin: Normal Psychiatry: Normal mood   Condition at discharge: stable  The results of significant diagnostics from this hospitalization (including imaging, microbiology, ancillary and laboratory) are listed below for reference.   Imaging Studies: ECHOCARDIOGRAM COMPLETE  Result Date: 10/20/2022    ECHOCARDIOGRAM REPORT   Patient Name:   Christina Cuevas Date of Exam: 10/20/2022 Medical Rec #:  [id number]    Height:       61.0 in Accession #:    [id number]   Weight:       141.1 lb Date of Birth:  22-Jan-1940    BSA:          1.628 m Patient Age:    81 years     BP:           166/78 mmHg Patient Gender: F            HR:           55 bpm. Exam Location:  Inpatient Procedure: 2D Echo, Cardiac Doppler and Color Doppler Indications:    Syncope R55  History:        Patient has no prior history of Echocardiogram examinations.                 Signs/Symptoms:Syncope; Risk Factors:Diabetes. CKD, stage 3.  Sonographer:    Lucendia Herrlich Referring Phys: [id number] MOHAMMAD L GARBA IMPRESSIONS  1. Left ventricular ejection fraction, by estimation, is 55 to 60%. The left ventricle has normal function. The left ventricle has no regional wall motion abnormalities. There is mild asymmetric left ventricular hypertrophy of the septal segment. Left ventricular diastolic parameters are consistent with Grade I diastolic dysfunction (impaired relaxation).  2. Right ventricular systolic function is normal. The right ventricular size is normal.  3. The mitral valve is normal in structure. No evidence of mitral valve  regurgitation. No evidence of mitral stenosis.  4. The aortic valve is normal in structure. Aortic valve regurgitation is mild to moderate. Aortic valve sclerosis/calcification is present, without any evidence of aortic stenosis.  5. The inferior vena cava is normal in size with greater than 50% respiratory variability, suggesting right atrial pressure of 3 mmHg. FINDINGS  Left Ventricle: Left ventricular ejection fraction, by estimation, is 55 to 60%. The left ventricle has normal function. The left ventricle has no regional wall motion abnormalities. The left ventricular internal cavity size was normal in size. There is  mild asymmetric left ventricular hypertrophy of the septal segment. Left ventricular diastolic parameters are consistent with Grade I diastolic dysfunction (impaired relaxation). Right Ventricle: The right ventricular size is normal. No increase in right ventricular wall thickness.  4 mg total) by mouth every 6 (six) hours as needed for nausea or vomiting.   pantoprazole 40 MG tablet Commonly known as: PROTONIX Take 40 mg by mouth daily.   simethicone 80 MG chewable tablet Commonly known as: MYLICON Chew 80 mg by mouth daily.        Follow-up Information     Marisue Ivan, MD. Schedule an appointment as soon as possible for a visit in 1 week(s).    Specialty: Family Medicine Contact information: [address] [facility] [place] [address] [phone]                Discharge Exam: Ceasar Mons Weights   10/19/22 2036 10/20/22 1342  Weight: 64 kg 64 kg   General: NAD  Cardiovascular: S1, S2 present Respiratory: CTAB Abdomen: Soft, nontender, nondistended, bowel sounds present Musculoskeletal: No bilateral pedal edema noted Skin: Normal Psychiatry: Normal mood   Condition at discharge: stable  The results of significant diagnostics from this hospitalization (including imaging, microbiology, ancillary and laboratory) are listed below for reference.   Imaging Studies: ECHOCARDIOGRAM COMPLETE  Result Date: 10/20/2022    ECHOCARDIOGRAM REPORT   Patient Name:   Christina Cuevas Date of Exam: 10/20/2022 Medical Rec #:  [id number]    Height:       61.0 in Accession #:    [id number]   Weight:       141.1 lb Date of Birth:  22-Jan-1940    BSA:          1.628 m Patient Age:    81 years     BP:           166/78 mmHg Patient Gender: F            HR:           55 bpm. Exam Location:  Inpatient Procedure: 2D Echo, Cardiac Doppler and Color Doppler Indications:    Syncope R55  History:        Patient has no prior history of Echocardiogram examinations.                 Signs/Symptoms:Syncope; Risk Factors:Diabetes. CKD, stage 3.  Sonographer:    Lucendia Herrlich Referring Phys: [id number] MOHAMMAD L GARBA IMPRESSIONS  1. Left ventricular ejection fraction, by estimation, is 55 to 60%. The left ventricle has normal function. The left ventricle has no regional wall motion abnormalities. There is mild asymmetric left ventricular hypertrophy of the septal segment. Left ventricular diastolic parameters are consistent with Grade I diastolic dysfunction (impaired relaxation).  2. Right ventricular systolic function is normal. The right ventricular size is normal.  3. The mitral valve is normal in structure. No evidence of mitral valve  regurgitation. No evidence of mitral stenosis.  4. The aortic valve is normal in structure. Aortic valve regurgitation is mild to moderate. Aortic valve sclerosis/calcification is present, without any evidence of aortic stenosis.  5. The inferior vena cava is normal in size with greater than 50% respiratory variability, suggesting right atrial pressure of 3 mmHg. FINDINGS  Left Ventricle: Left ventricular ejection fraction, by estimation, is 55 to 60%. The left ventricle has normal function. The left ventricle has no regional wall motion abnormalities. The left ventricular internal cavity size was normal in size. There is  mild asymmetric left ventricular hypertrophy of the septal segment. Left ventricular diastolic parameters are consistent with Grade I diastolic dysfunction (impaired relaxation). Right Ventricle: The right ventricular size is normal. No increase in right ventricular wall thickness.  4 mg total) by mouth every 6 (six) hours as needed for nausea or vomiting.   pantoprazole 40 MG tablet Commonly known as: PROTONIX Take 40 mg by mouth daily.   simethicone 80 MG chewable tablet Commonly known as: MYLICON Chew 80 mg by mouth daily.        Follow-up Information     Marisue Ivan, MD. Schedule an appointment as soon as possible for a visit in 1 week(s).    Specialty: Family Medicine Contact information: [address] [facility] [place] [address] [phone]                Discharge Exam: Ceasar Mons Weights   10/19/22 2036 10/20/22 1342  Weight: 64 kg 64 kg   General: NAD  Cardiovascular: S1, S2 present Respiratory: CTAB Abdomen: Soft, nontender, nondistended, bowel sounds present Musculoskeletal: No bilateral pedal edema noted Skin: Normal Psychiatry: Normal mood   Condition at discharge: stable  The results of significant diagnostics from this hospitalization (including imaging, microbiology, ancillary and laboratory) are listed below for reference.   Imaging Studies: ECHOCARDIOGRAM COMPLETE  Result Date: 10/20/2022    ECHOCARDIOGRAM REPORT   Patient Name:   Christina Cuevas Date of Exam: 10/20/2022 Medical Rec #:  [id number]    Height:       61.0 in Accession #:    [id number]   Weight:       141.1 lb Date of Birth:  22-Jan-1940    BSA:          1.628 m Patient Age:    81 years     BP:           166/78 mmHg Patient Gender: F            HR:           55 bpm. Exam Location:  Inpatient Procedure: 2D Echo, Cardiac Doppler and Color Doppler Indications:    Syncope R55  History:        Patient has no prior history of Echocardiogram examinations.                 Signs/Symptoms:Syncope; Risk Factors:Diabetes. CKD, stage 3.  Sonographer:    Lucendia Herrlich Referring Phys: [id number] MOHAMMAD L GARBA IMPRESSIONS  1. Left ventricular ejection fraction, by estimation, is 55 to 60%. The left ventricle has normal function. The left ventricle has no regional wall motion abnormalities. There is mild asymmetric left ventricular hypertrophy of the septal segment. Left ventricular diastolic parameters are consistent with Grade I diastolic dysfunction (impaired relaxation).  2. Right ventricular systolic function is normal. The right ventricular size is normal.  3. The mitral valve is normal in structure. No evidence of mitral valve  regurgitation. No evidence of mitral stenosis.  4. The aortic valve is normal in structure. Aortic valve regurgitation is mild to moderate. Aortic valve sclerosis/calcification is present, without any evidence of aortic stenosis.  5. The inferior vena cava is normal in size with greater than 50% respiratory variability, suggesting right atrial pressure of 3 mmHg. FINDINGS  Left Ventricle: Left ventricular ejection fraction, by estimation, is 55 to 60%. The left ventricle has normal function. The left ventricle has no regional wall motion abnormalities. The left ventricular internal cavity size was normal in size. There is  mild asymmetric left ventricular hypertrophy of the septal segment. Left ventricular diastolic parameters are consistent with Grade I diastolic dysfunction (impaired relaxation). Right Ventricle: The right ventricular size is normal. No increase in right ventricular wall thickness.  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 Patient was evaluated in the ED the day prior to admission and was discharged home with prescriptions. She presented again with nausea. While in the waiting room patient had a syncopal episode and noted to be bradycardic. Pt admitted for further management.    Patient very eager to be discharged. Pt reports feeling better, denies any N/V, fever/chills, SOB. HR improved. No further syncopal episodes.   Assessment and Plan:  Syncope Likely from orthostatic hypotension (positive initially- on discharge resolved)  No further syncopal episode Trop negative ECHO with EF of 55-60%, grade 1 DD CT Head; No acute intracranial pathology Telemetry with no acute events   Persistent nausea Resolved LFT normal.  CT abdomen pelvis: No acute findings within the abdomen or pelvis. No signs of bowel obstruction   UTI Reported dysuria, UA with 50 WBC>  UC with multiple species S/p IV Ceftriaxone-->  Complete with cefadroxil for a total of 5 days   Diabetes type 2 Continue home regimen   Chronic kidney disease stage IIIa Stable    GERD Resume PPI    History of MS Support care   Depression Resume Cymbalta   Hypothyroidism Resume synthroid    COPD Stable     Consultants: None Procedures performed: None Disposition: Home Diet recommendation:  Carb modified diet   DISCHARGE MEDICATION: Allergies as of 10/21/2022       Reactions   Floxacillin (flucloxacillin)    Cephalexin Nausea Only   Ciprofloxacin Nausea Only   Metronidazole Nausea Only        Medication List     TAKE these medications    atorvastatin 20 MG tablet Commonly known as: LIPITOR Take 20 mg by mouth at bedtime.   cefadroxil 500 MG capsule Commonly known as: DURICEF Take 1 capsule (500 mg total) by mouth daily for 3 days.   cetirizine 10 MG tablet Commonly known as: ZYRTEC Take 10 mg by mouth daily.   Cholecalciferol 25 MCG (1000 UT) tablet Take 4,000 Units by mouth 2 (two) times daily. 2000 mg in morning and again at night   DULoxetine 30 MG capsule Commonly known as: CYMBALTA Take 30 mg by mouth daily.   levothyroxine 88 MCG tablet Commonly known as: SYNTHROID Take 88 mcg by mouth daily before breakfast.   lisinopril 10 MG tablet Commonly known as: ZESTRIL Take 5 mg by mouth daily.   loperamide 2 MG capsule Commonly known as: IMODIUM Take 4-6 mg by mouth as needed for diarrhea or loose stools.   ondansetron 4 MG disintegrating tablet Commonly known as: ZOFRAN-ODT Take 1 tablet (

## 2022-12-08 ENCOUNTER — Encounter: Payer: Self-pay | Admitting: Obstetrics

## 2022-12-08 ENCOUNTER — Ambulatory Visit: Payer: Medicare HMO | Admitting: Obstetrics

## 2022-12-08 VITALS — BP 140/80 | HR 70 | Ht 62.0 in | Wt 152.0 lb

## 2022-12-08 DIAGNOSIS — Z4689 Encounter for fitting and adjustment of other specified devices: Secondary | ICD-10-CM

## 2022-12-08 DIAGNOSIS — R32 Unspecified urinary incontinence: Secondary | ICD-10-CM

## 2022-12-08 DIAGNOSIS — N814 Uterovaginal prolapse, unspecified: Secondary | ICD-10-CM

## 2022-12-08 NOTE — Progress Notes (Signed)
    GYNECOLOGY PROGRESS NOTE  Subjective:  PCP: Marisue Ivan, MD  Patient ID: Christina Cuevas, female    DOB: 1940-04-05, 82 y.o.   MRN: [id number]  HPI  Patient is a 82 y.o. [id number] female who presents for pessary cleaning. Pt used to see Dr. Tiburcio Pea for pessary management due to cystocele and uterine prolapse. Has not had pessary removed since last visit Oct 2022. Per records, pt with a knobbed ring #6. States her incontinence has returned. Denies vaginal symptoms.   The following portions of the patient's history were reviewed and updated as appropriate: allergies, current medications, past family history, past medical history, past social history, past surgical history, and problem list.  Review of Systems Pertinent items are noted in HPI.   Objective:   Blood pressure (!) 140/80, pulse 70, height 5\' 2"  (1.575 m), weight 152 lb (68.9 kg). Body mass index is 27.8 kg/m.  General appearance: alert and cooperative Abdomen: soft, non-tender; bowel sounds normal; no masses,  no organomegaly Pelvic: cervix normal in appearance, external genitalia normal, no adnexal masses or tenderness, no cervical motion tenderness, rectovaginal septum normal, uterus normal size, shape, and consistency, vagina normal without discharge, and vaginal mucosa without erosions or ulcers, but very red and raw.  Extremities: extremities normal, atraumatic, no cyanosis or edema Neurologic: Grossly normal  Pessary Care Pessary removed and cleaned with the assistance of Dr. Marice Potter. Vagina checked and without erosions/ulcers, but red and raw. Copious foul-smelling discharge. Pessary cleaned and returned to patient. Fitted with a #5, but too small.   Assessment/Plan:   1. Cystocele with uterine descensus   2. Incontinence of urine in female     Pessary was cleaned and left out today.  RTC in 2wks for reinsertion, pt has #6 ring with her, instructed to bring back for reinsertion. Will need teaching for home removal  and reinsertion.   Total time was 30 minutes. That includes chart review before the visit, the actual patient visit, and time spent on documentation after the visit. Time excludes procedures, if any.    Julieanne Manson, DO [facility]

## 2022-12-22 ENCOUNTER — Ambulatory Visit: Payer: Medicare HMO | Admitting: Obstetrics

## 2022-12-22 ENCOUNTER — Encounter: Payer: Self-pay | Admitting: Obstetrics

## 2022-12-22 VITALS — BP 121/65 | HR 78 | Ht 61.0 in | Wt 150.0 lb

## 2022-12-22 DIAGNOSIS — Z4689 Encounter for fitting and adjustment of other specified devices: Secondary | ICD-10-CM

## 2022-12-22 DIAGNOSIS — N814 Uterovaginal prolapse, unspecified: Secondary | ICD-10-CM

## 2022-12-22 DIAGNOSIS — R32 Unspecified urinary incontinence: Secondary | ICD-10-CM | POA: Diagnosis not present

## 2022-12-22 NOTE — Progress Notes (Signed)
  HPI:      [name] is a 82 y.o. [phone] who presents today for her pessary follow up and examination related to her pelvic floor weakening.  Pt last seen on Nov 26th, 2024 and had not had her pessary removed or cleaned in over 14yrs. Pessary was removed at that time, some small vaginal ulcers were noted, instructed to keep out for 2wks and returns today for reinsertion. Denies vaginal bleeding and no vaginal discharge.  Symptoms of pelvic floor weakening seem to be similar to when she had pessary in place.  She also c/o of constant dribbling/leaking and needs to wear pads and change them 4-5 times daily. She is considering surgery in place of the pessary.    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 Also,  has a past surgical history that includes Colonoscopy; Breast lumpectomy (Left, 02/2009); Breast surgery (Right, 02/2009); Appendectomy (1950); and Bunionectomy (Left, 03/25/2018)., family history includes Breast cancer in her cousin; Cancer in her paternal uncle; Migraines in her maternal grandmother; Pancreatic cancer in her father; Parkinson's disease in her mother.,  reports that she has never smoked. She has never used smokeless tobacco. She reports that she does not drink alcohol and does not use drugs.  She has a current medication list which includes the following prescription(s): atorvastatin, cetirizine, cholecalciferol, duloxetine, levothyroxine, lisinopril, loperamide, ondansetron, pantoprazole, and simethicone. Also, is allergic to floxacillin (flucloxacillin), cephalexin, ciprofloxacin, and  metronidazole.  ROS  Objective: BP 121/65   Pulse 78   Ht 5\' 1"  (1.549 m)   Wt 150 lb (68 kg)   BMI 28.34 kg/m  OBGyn Exam Pelvic Exam: POP-Stage 3 cystocele, erosions from prior have healed, no abnormal discharge or odor  Pessary Care Pessary: ring with knob #6 reinserted and unable to properly place without pain. Performed refitting with #5 ring with knob, #5 ring, both expelled with valsalva. #6 ring without knob remained in place.   A/P: Patient fitted with new ring #6 today, but not in stock. We ordered device for her. Thorough discussion re: if she wants to continue with pessary vs surgically manage her prolapse. She would like to discuss the surgery to learn more about it and have options. If new pessary controls her symptoms well, she may continue management with device.   Return to clinic in 1 week to have consultation with Drs. Cherry or Evans for possible TOT; if pessary has arrived, can have reinserted at that time if pt desires. If arrives sooner, will schedule patient to come in for reinsertion before consultation.    Total time was 35 minutes. That includes chart review before the visit, the actual patient visit, and time spent on documentation after the visit. Time excludes procedures, if any.    Julieanne Manson, DO [facility]

## 2023-01-14 ENCOUNTER — Encounter: Payer: Self-pay | Admitting: Obstetrics and Gynecology

## 2023-01-14 ENCOUNTER — Ambulatory Visit: Payer: Medicare HMO | Admitting: Obstetrics and Gynecology

## 2023-01-14 VITALS — BP 127/78 | HR 72 | Ht 61.0 in | Wt 153.9 lb

## 2023-01-14 DIAGNOSIS — Z4689 Encounter for fitting and adjustment of other specified devices: Secondary | ICD-10-CM | POA: Diagnosis not present

## 2023-01-14 DIAGNOSIS — N8111 Cystocele, midline: Secondary | ICD-10-CM

## 2023-01-14 DIAGNOSIS — N8189 Other female genital prolapse: Secondary | ICD-10-CM | POA: Diagnosis not present

## 2023-01-14 DIAGNOSIS — R32 Unspecified urinary incontinence: Secondary | ICD-10-CM

## 2023-01-14 DIAGNOSIS — N814 Uterovaginal prolapse, unspecified: Secondary | ICD-10-CM

## 2023-01-14 MED ORDER — OXYBUTYNIN CHLORIDE ER 10 MG PO TB24: 10.0000 mg | ORAL_TABLET | Freq: Every day | ORAL | 0 refills | Status: DC

## 2023-01-14 MED ORDER — TRIMO-SAN 0.025 % VA GEL
VAGINAL | 2 refills | Status: AC
Start: 2023-01-14 — End: ?

## 2023-01-14 NOTE — Progress Notes (Signed)
 Patient presents today for pessary insertion and surgical consult. She reports constant urinary leaking but no pain or pressure.

## 2023-01-14 NOTE — Progress Notes (Signed)
 HPI:      [name] is a 83 y.o. G3P3003 who LMP was No LMP recorded. Patient is postmenopausal.  Subjective:   She presents today for pessary reinsertion.  She had her pessary in place for more than 2 years.  She presented 6 weeks ago and had it removed since that time she has been resized for a pessary.  It has come in and she would like it replaced today. In addition she complains of daily urine loss and she is considering surgical options for this.  She does report that she has no bulge at the opening of the vagina and nothing falling out.  When discussing her urine loss she reports large amounts of urine loss, a significant urge to go to the bathroom immediately.  Stress incontinence with coughing laughing sneezing does not seem to play a major role in her urine loss. She is not currently sexually active.    Hx: The following portions of the patient's history were reviewed and updated as appropriate:             She  has a past medical history of Actinic keratosis, Basal cell carcinoma (05/18/2006), Breast cancer (HCC) (2011), Chronic kidney disease, Colitis, Diverticulitis, Dysplastic nevus (01/18/2007), Dysplastic nevus (01/18/2007), Hyperchloremia, Hypertension, Hypothyroidism, Melanoma in situ (11/23/2006), Pre-diabetes, and Type 2 diabetes mellitus without complication, without long-term current use of insulin  (HCC) (12/20/2017). She does not have any pertinent problems on file. She  has a past surgical history that includes Colonoscopy; Breast lumpectomy (Left, 02/2009); Breast surgery (Right, 02/2009); Appendectomy (1950); and Bunionectomy (Left, 03/25/2018). Her family history includes Breast cancer in her cousin; Cancer in her paternal uncle; Migraines in her maternal grandmother; Pancreatic cancer in her father; Parkinson's disease in her mother. She  reports that she has never smoked. She has never used smokeless tobacco. She reports that she does not drink alcohol and does not  use drugs. She has a current medication list which includes the following prescription(s): atorvastatin, cetirizine, cholecalciferol, duloxetine , levothyroxine , lisinopril, loperamide , ondansetron , oxybutynin , trimo-san, pantoprazole , and simethicone . She is allergic to floxacillin (flucloxacillin), cephalexin , ciprofloxacin, and metronidazole .       Review of Systems:  Review of Systems  Constitutional: Denied constitutional symptoms, night sweats, recent illness, fatigue, fever, insomnia and weight loss.  Eyes: Denied eye symptoms, eye pain, photophobia, vision change and visual disturbance.  Ears/Nose/Throat/Neck: Denied ear, nose, throat or neck symptoms, hearing loss, nasal discharge, sinus congestion and sore throat.  Cardiovascular: Denied cardiovascular symptoms, arrhythmia, chest pain/pressure, edema, exercise intolerance, orthopnea and palpitations.  Respiratory: Denied pulmonary symptoms, asthma, pleuritic pain, productive sputum, cough, dyspnea and wheezing.  Gastrointestinal: Denied, gastro-esophageal reflux, melena, nausea and vomiting.  Genitourinary: See HPI for additional information.  Musculoskeletal: Denied musculoskeletal symptoms, stiffness, swelling, muscle weakness and myalgia.  Dermatologic: Denied dermatology symptoms, rash and scar.  Neurologic: Denied neurology symptoms, dizziness, headache, neck pain and syncope.  Psychiatric: Denied psychiatric symptoms, anxiety and depression.  Endocrine: Denied endocrine symptoms including hot flashes and night sweats.   Meds:   Current Outpatient Medications on File Prior to Visit  Medication Sig Dispense Refill   atorvastatin (LIPITOR) 20 MG tablet Take 20 mg by mouth at bedtime.     cetirizine (ZYRTEC) 10 MG tablet Take 10 mg by mouth daily.      Cholecalciferol 25 MCG (1000 UT) tablet Take 4,000 Units by mouth 2 (two) times daily. 2000 mg in morning and again at night     DULoxetine  (CYMBALTA ) 30 MG capsule  Take 30 mg by  mouth daily.      levothyroxine  (SYNTHROID , LEVOTHROID) 88 MCG tablet Take 88 mcg by mouth daily before breakfast.      lisinopril (PRINIVIL,ZESTRIL) 10 MG tablet Take 5 mg by mouth daily.     loperamide  (IMODIUM ) 2 MG capsule Take 4-6 mg by mouth as needed for diarrhea or loose stools.     ondansetron  (ZOFRAN -ODT) 4 MG disintegrating tablet Take 1 tablet (4 mg total) by mouth every 6 (six) hours as needed for nausea or vomiting. 20 tablet 0   pantoprazole  (PROTONIX ) 40 MG tablet Take 40 mg by mouth daily.     simethicone  (MYLICON) 80 MG chewable tablet Chew 80 mg by mouth daily.     No current facility-administered medications on file prior to visit.      Objective:     Vitals:   01/14/23 0827  BP: 127/78  Pulse: 72   Filed Weights   01/14/23 0827  Weight: 153 lb 14.4 oz (69.8 kg)              Physical examination   Pelvic:   Vulva: Normal appearance.  No lesions.  Vagina: No lesions or abnormalities noted.  Vaginal atrophy  Support: Fourth degree cystocele second-degree rectocele third-degree uterine descensus  Urethra No masses tenderness or scarring.  Meatus Normal size without lesions or prolapse.  Cervix: Normal appearance.  No lesions.  Anus: Normal exam.  No lesions.  Perineum: Normal exam.  No lesions.        Bimanual   Uterus: Normal size.  Non-tender.  Mobile.  AV.  Adnexae: No masses.  Non-tender to palpation.  Cul-de-sac: Negative for abnormality.           Pessary replaced  Assessment:    G3P3003 Patient Active Problem List   Diagnosis Date Noted   Primary malignant neoplasm of upper outer quadrant of female breast (HCC) 10/19/2022   Syncope and collapse 10/19/2022   Stage 3a chronic kidney disease (HCC) 05/18/2022   MS (multiple sclerosis) (HCC) 12/31/2020   Emphysema lung (HCC) 10/04/2020   DM type 2 with diabetic peripheral neuropathy (HCC) 12/20/2017   Type 2 diabetes mellitus without complication, without long-term current use of insulin  (HCC)  12/20/2017   Incontinence of urine in female 07/28/2017   Cystocele with uterine descensus 04/20/2016   Acquired hypothyroidism 07/03/2013   GERD without esophagitis 07/03/2013   Pure hypercholesterolemia 07/03/2013     1. Cystocele with uterine descensus   2. Incontinence of urine in female   3. Pessary maintenance   4. Pelvic relaxation disorder   5. Midline cystocele     Patient not symptomatic with cystocele other than significant urine loss.  She has had a pessary in the past and that has seemed to work well for her with the exception of daily urine loss. Urine loss is either with mixed incontinence or urge incontinence.  Most of her significant symptoms are consistent with urge incontinence.   Plan:            1.  She has expressed her desire for surgical correction of her prolapse and urine loss.  However because her urine loss seems to be mostly urge incontinence I have advised her to try medication with pessary in place.  If this is helpful to her and she is satisfied no further workup will be necessary.  Should she fail medication use would consider definitive surgery including LAVH anterior posterior repair with TOT. Would strongly recommend 6 weeks  of estrogen use vaginally prior to any surgical correction.  Orders No orders of the defined types were placed in this encounter.    Meds ordered this encounter  Medications   OXYQUINOLONE SULFATE VAGINAL (TRIMO-SAN) 0.025 % GEL    Sig: INSERT 1/3 APPLICATORFUL VAGINALLY EVERY OTHER DAY AS DIRECTED    Dispense:  113 g    Refill:  2   oxybutynin  (DITROPAN  XL) 10 MG 24 hr tablet    Sig: Take 1 tablet (10 mg total) by mouth at bedtime.    Dispense:  90 tablet    Refill:  0      F/U  Return in about 6 weeks (around 02/25/2023). I spent 35 minutes involved in the care of this patient preparing to see the patient by obtaining and reviewing her medical history (including labs, imaging tests and prior procedures), documenting  clinical information in the electronic health record (EHR), counseling and coordinating care plans, writing and sending prescriptions, ordering tests or procedures and in direct communicating with the patient and medical staff discussing pertinent items from her history and physical exam.  Alm DOROTHA Sar, M.D. 01/14/2023 9:29 AM

## 2023-02-24 ENCOUNTER — Ambulatory Visit (INDEPENDENT_AMBULATORY_CARE_PROVIDER_SITE_OTHER): Payer: Medicare HMO | Admitting: Obstetrics and Gynecology

## 2023-02-24 ENCOUNTER — Encounter: Payer: Self-pay | Admitting: Obstetrics and Gynecology

## 2023-02-24 VITALS — BP 121/82 | HR 69 | Ht 61.0 in | Wt 146.0 lb

## 2023-02-24 DIAGNOSIS — N814 Uterovaginal prolapse, unspecified: Secondary | ICD-10-CM

## 2023-02-24 DIAGNOSIS — Z4689 Encounter for fitting and adjustment of other specified devices: Secondary | ICD-10-CM

## 2023-02-24 DIAGNOSIS — R32 Unspecified urinary incontinence: Secondary | ICD-10-CM

## 2023-02-24 DIAGNOSIS — N8111 Cystocele, midline: Secondary | ICD-10-CM

## 2023-02-24 DIAGNOSIS — N393 Stress incontinence (female) (male): Secondary | ICD-10-CM | POA: Diagnosis not present

## 2023-02-24 DIAGNOSIS — N8189 Other female genital prolapse: Secondary | ICD-10-CM

## 2023-02-24 MED ORDER — ESTRADIOL 0.1 MG/GM VA CREA
0.2500 | TOPICAL_CREAM | Freq: Every day | VAGINAL | 3 refills | Status: DC
Start: 2023-02-24 — End: 2023-04-26

## 2023-02-24 NOTE — Progress Notes (Signed)
HPI:      [name] is a 83 y.o. G3P3003 who LMP was No LMP recorded. Patient is postmenopausal.  Subjective:   She presents today with her daughter to discuss the possibility of surgical correction of her pelvic relaxation.  She has been experiencing problems with significant urine loss despite use of Ditropan and a knob pessary.  She has both urge and stress incontinence, saying that her stress incontinence has become worse in the last 6 weeks.  Her previous exam shows a large cystocele and significant pelvic relaxation.    Hx: The following portions of the patient's history were reviewed and updated as appropriate:             She  has a past medical history of Actinic keratosis, Basal cell carcinoma (05/18/2006), Breast cancer (HCC) (2011), Chronic kidney disease, Colitis, Diverticulitis, Dysplastic nevus (01/18/2007), Dysplastic nevus (01/18/2007), Hyperchloremia, Hypertension, Hypothyroidism, Melanoma in situ (11/23/2006), Pre-diabetes, and Type 2 diabetes mellitus without complication, without long-term current use of insulin (HCC) (12/20/2017). She does not have any pertinent problems on file. She  has a past surgical history that includes Colonoscopy; Breast lumpectomy (Left, 02/2009); Breast surgery (Right, 02/2009); Appendectomy (1950); and Bunionectomy (Left, 03/25/2018). Her family history includes Breast cancer in her cousin; Cancer in her paternal uncle; Migraines in her maternal grandmother; Pancreatic cancer in her father; Parkinson's disease in her mother. She  reports that she has never smoked. She has never used smokeless tobacco. She reports that she does not drink alcohol and does not use drugs. She has a current medication list which includes the following prescription(s): atorvastatin, cetirizine, cholecalciferol, duloxetine, estradiol, levothyroxine, lisinopril, loperamide, ondansetron, oxybutynin, pantoprazole, simethicone, and trimo-san. She is allergic to floxacillin  (flucloxacillin), cephalexin, ciprofloxacin, and metronidazole.       Review of Systems:  Review of Systems  Constitutional: Denied constitutional symptoms, night sweats, recent illness, fatigue, fever, insomnia and weight loss.  Eyes: Denied eye symptoms, eye pain, photophobia, vision change and visual disturbance.  Ears/Nose/Throat/Neck: Denied ear, nose, throat or neck symptoms, hearing loss, nasal discharge, sinus congestion and sore throat.  Cardiovascular: Denied cardiovascular symptoms, arrhythmia, chest pain/pressure, edema, exercise intolerance, orthopnea and palpitations.  Respiratory: Denied pulmonary symptoms, asthma, pleuritic pain, productive sputum, cough, dyspnea and wheezing.  Gastrointestinal: Denied, gastro-esophageal reflux, melena, nausea and vomiting.  Genitourinary: See HPI for additional information.  Musculoskeletal: Denied musculoskeletal symptoms, stiffness, swelling, muscle weakness and myalgia.  Dermatologic: Denied dermatology symptoms, rash and scar.  Neurologic: Denied neurology symptoms, dizziness, headache, neck pain and syncope.  Psychiatric: Denied psychiatric symptoms, anxiety and depression.  Endocrine: Denied endocrine symptoms including hot flashes and night sweats.   Meds:   Current Outpatient Medications on File Prior to Visit  Medication Sig Dispense Refill   atorvastatin (LIPITOR) 20 MG tablet Take 20 mg by mouth at bedtime.     cetirizine (ZYRTEC) 10 MG tablet Take 10 mg by mouth daily.      Cholecalciferol 25 MCG (1000 UT) tablet Take 4,000 Units by mouth 2 (two) times daily. 2000 mg in morning and again at night     DULoxetine (CYMBALTA) 30 MG capsule Take 30 mg by mouth daily.      levothyroxine (SYNTHROID, LEVOTHROID) 88 MCG tablet Take 88 mcg by mouth daily before breakfast.      lisinopril (PRINIVIL,ZESTRIL) 10 MG tablet Take 5 mg by mouth daily.     loperamide (IMODIUM) 2 MG capsule Take 4-6 mg by mouth as needed for diarrhea or loose  stools.  ondansetron (ZOFRAN-ODT) 4 MG disintegrating tablet Take 1 tablet (4 mg total) by mouth every 6 (six) hours as needed for nausea or vomiting. 20 tablet 0   oxybutynin (DITROPAN XL) 10 MG 24 hr tablet Take 1 tablet (10 mg total) by mouth at bedtime. 90 tablet 0   pantoprazole (PROTONIX) 40 MG tablet Take 40 mg by mouth daily.     simethicone (MYLICON) 80 MG chewable tablet Chew 80 mg by mouth daily.     OXYQUINOLONE SULFATE VAGINAL (TRIMO-SAN) 0.025 % GEL INSERT 1/3 APPLICATORFUL VAGINALLY EVERY OTHER DAY AS DIRECTED (Patient not taking: Reported on 02/24/2023) 113 g 2   No current facility-administered medications on file prior to visit.      Objective:     Vitals:   02/24/23 0850  BP: 121/82  Pulse: 69   Filed Weights   02/24/23 0850  Weight: 146 lb (66.2 kg)                        Assessment:    G3P3003 Patient Active Problem List   Diagnosis Date Noted   Primary malignant neoplasm of upper outer quadrant of female breast (HCC) 10/19/2022   Syncope and collapse 10/19/2022   Stage 3a chronic kidney disease (HCC) 05/18/2022   MS (multiple sclerosis) (HCC) 12/31/2020   Emphysema lung (HCC) 10/04/2020   DM type 2 with diabetic peripheral neuropathy (HCC) 12/20/2017   Type 2 diabetes mellitus without complication, without long-term current use of insulin (HCC) 12/20/2017   Incontinence of urine in female 07/28/2017   Cystocele with uterine descensus 04/20/2016   Acquired hypothyroidism 07/03/2013   GERD without esophagitis 07/03/2013   Pure hypercholesterolemia 07/03/2013     1. Incontinence of urine in female   2. Cystocele with uterine descensus   3. Pessary maintenance   4. Pelvic relaxation disorder   5. Midline cystocele     Above associated with mixed urinary incontinence and failure of medication.   Plan:            1.  We have discussed risk benefits of surgery in someone her age, we have discussed tissue healing as well as the difficulty  with a large cystocele.  We have discussed use of mesh for TOT and the success and failure rate for TOT.  All of their questions have been answered. She desires definitive surgery. Would plan for LAVH BSO A&P repair TOT Orders No orders of the defined types were placed in this encounter.    Meds ordered this encounter  Medications   estradiol (ESTRACE) 0.1 MG/GM vaginal cream    Sig: Place 0.25 Applicatorfuls vaginally at bedtime.    Dispense:  90 g    Refill:  3      F/U  Return in about 4 weeks (around 03/24/2023).  Elonda Husky, M.D. 02/24/2023 9:50 AM

## 2023-02-24 NOTE — Progress Notes (Signed)
Patient presents today to follow-up on urine loss. She states since her last visit her urine loss has worsened, continuing daily Ditropan along with current pessary placement. She states at this time she is wanting to discuss surgical options.

## 2023-03-24 ENCOUNTER — Encounter: Payer: Self-pay | Admitting: Obstetrics and Gynecology

## 2023-03-24 ENCOUNTER — Ambulatory Visit: Payer: Medicare HMO | Admitting: Obstetrics and Gynecology

## 2023-03-24 VITALS — BP 99/62 | HR 66 | Ht 61.0 in | Wt 146.1 lb

## 2023-03-24 DIAGNOSIS — Z01818 Encounter for other preprocedural examination: Secondary | ICD-10-CM | POA: Diagnosis not present

## 2023-03-24 DIAGNOSIS — N814 Uterovaginal prolapse, unspecified: Secondary | ICD-10-CM

## 2023-03-24 DIAGNOSIS — N8189 Other female genital prolapse: Secondary | ICD-10-CM

## 2023-03-24 DIAGNOSIS — R32 Unspecified urinary incontinence: Secondary | ICD-10-CM

## 2023-03-24 DIAGNOSIS — Z4689 Encounter for fitting and adjustment of other specified devices: Secondary | ICD-10-CM

## 2023-03-24 MED ORDER — METRONIDAZOLE 500 MG PO TABS: 500.0000 mg | ORAL_TABLET | Freq: Two times a day (BID) | ORAL | 0 refills | Status: DC

## 2023-03-24 NOTE — Progress Notes (Signed)
 PRE-OPERATIVE HISTORY AND PHYSICAL EXAM  PCP:  Marisue Ivan, MD Subjective:   HPI:  Christina Cuevas is a 83 y.o. [id number].  No LMP recorded. Patient is postmenopausal.  She presents today for a pre-op discussion and PE.  She has the following symptoms: Pelvic relaxation, stress urinary incontinence  She has been using a pessary and oxybutynin.  She does not like the pessary.  She has also continued to experience significant urine loss despite the pessary and despite use of oxybutynin.  She is requesting definitive surgery.  Review of Systems:   Constitutional: Denied constitutional symptoms, night sweats, recent illness, fatigue, fever, insomnia and weight loss.  Eyes: Denied eye symptoms, eye pain, photophobia, vision change and visual disturbance.  Ears/Nose/Throat/Neck: Denied ear, nose, throat or neck symptoms, hearing loss, nasal discharge, sinus congestion and sore throat.  Cardiovascular: Denied cardiovascular symptoms, arrhythmia, chest pain/pressure, edema, exercise intolerance, orthopnea and palpitations.  Respiratory: Denied pulmonary symptoms, asthma, pleuritic pain, productive sputum, cough, dyspnea and wheezing.  Gastrointestinal: Denied, gastro-esophageal reflux, melena, nausea and vomiting.  Genitourinary: See HPI for additional information.  Musculoskeletal: Denied musculoskeletal symptoms, stiffness, swelling, muscle weakness and myalgia.  Dermatologic: Denied dermatology symptoms, rash and scar.  Neurologic: Denied neurology symptoms, dizziness, headache, neck pain and syncope.  Psychiatric: Denied psychiatric symptoms, anxiety and depression.  Endocrine: Denied endocrine symptoms including hot flashes and night sweats.   OB History  Gravida Para Term Preterm AB Living  3 3 3   3   SAB IAB Ectopic Multiple Live Births          # Outcome Date GA Lbr Len/2nd Weight Sex Type Anes PTL Lv  3 Term           2 Term           1 Term             Past Medical  History:  Diagnosis Date   Actinic keratosis    Basal cell carcinoma 05/18/2006   Left pretibia   Breast cancer (HCC) 2011   left breast cancer treated with radiation   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 Slight to moderate atypia   Dysplastic nevus 01/18/2007   Right sup lat thigh. Moderate atypia, extends to one edge.   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 Excised 01/10/2007, residual MIS, edges free   Pre-diabetes    diet controlled   Type 2 diabetes mellitus without complication, without long-term current use of insulin (HCC) 12/20/2017    Past Surgical History:  Procedure Laterality Date   APPENDECTOMY  1950   BREAST LUMPECTOMY Left 02/2009   lymph nodes removed   BREAST SURGERY Right 02/2009   sclerosing lesion removed at same time   BUNIONECTOMY Left 03/25/2018   Procedure: DOUBLE OSTEOTOMY;  Surgeon: Gwyneth Revels, DPM;  Location: [facility];  Service: Podiatry;  Laterality: Left;   COLONOSCOPY        SOCIAL HISTORY:  Social History   Tobacco Use  Smoking Status Never  Smokeless Tobacco Never   Social History   Substance and Sexual Activity  Alcohol Use No    Social History   Substance and Sexual Activity  Drug Use No    Family History  Problem Relation Age of Onset   Parkinson's disease Mother    Pancreatic cancer Father    Cancer Paternal Uncle        2 uncles    Migraines Maternal Grandmother    Breast cancer Cousin     ALLERGIES:  Floxacillin (flucloxacillin), Cephalexin, Ciprofloxacin, and Metronidazole  MEDS:   Current Outpatient Medications on File Prior to Visit  Medication Sig Dispense Refill   atorvastatin (LIPITOR) 20 MG tablet Take 20 mg by mouth at bedtime.     cetirizine (ZYRTEC) 10 MG tablet Take 10 mg by mouth daily.       Cholecalciferol 25 MCG (1000 UT) tablet Take 4,000 Units by mouth 2 (two) times daily. 2000 mg in morning and again at night     DULoxetine (CYMBALTA) 30 MG capsule Take 30 mg by mouth daily.      estradiol (ESTRACE) 0.1 MG/GM vaginal cream Place 0.25 Applicatorfuls vaginally at bedtime. 90 g 3   levothyroxine (SYNTHROID, LEVOTHROID) 88 MCG tablet Take 88 mcg by mouth daily before breakfast.      lisinopril (PRINIVIL,ZESTRIL) 10 MG tablet Take 5 mg by mouth daily.     loperamide (IMODIUM) 2 MG capsule Take 4-6 mg by mouth as needed for diarrhea or loose stools.     ondansetron (ZOFRAN-ODT) 4 MG disintegrating tablet Take 1 tablet (4 mg total) by mouth every 6 (six) hours as needed for nausea or vomiting. 20 tablet 0   oxybutynin (DITROPAN XL) 10 MG 24 hr tablet Take 1 tablet (10 mg total) by mouth at bedtime. 90 tablet 0   OXYQUINOLONE SULFATE VAGINAL (TRIMO-SAN) 0.025 % GEL INSERT 1/3 APPLICATORFUL VAGINALLY EVERY OTHER DAY AS DIRECTED 113 g 2   pantoprazole (PROTONIX) 40 MG tablet Take 40 mg by mouth daily.     simethicone (MYLICON) 80 MG chewable tablet Chew 80 mg by mouth daily.     No current facility-administered medications on file prior to visit.    No orders of the defined types were placed in this encounter.   BP 99/62   Pulse 66   Ht 5\' 1"  (1.549 m)   Wt 146 lb 1.6 oz (66.3 kg)   BMI 27.61 kg/m   Physical examination General NAD, Conversant  HEENT Atraumatic; Op clear with mmm.  Normo-cephalic.  Anicteric sclerae  Thyroid/Neck Smooth without nodularity or enlargement. Normal ROM.  Neck Supple.  Skin No rashes, lesions or ulceration. Normal palpated skin turgor. No nodularity.  Breasts: No masses or discharge.  Symmetric.  No axillary adenopathy.  Lungs: Clear to auscultation.No rales or wheezes. Normal Respiratory effort, no retractions.  Heart: NSR.  No murmurs or rubs appreciated. No peripheral edema  Abdomen: Soft.  Non-tender.  No masses.  No HSM. No hernia   Extremities: Moves all appropriately.  Normal ROM for age. No lymphadenopathy.  Neuro: Oriented to PPT.  Normal mood. Normal affect.     Pelvic:   Vulva: Normal appearance.  No lesions.  Vagina: No lesions or abnormalities noted.  Pessary in place  Support: Third-degree cystocele second-degree rectocele with uterine descensus.  Urethra No masses tenderness or scarring.  Meatus Normal size without lesions or prolapse.  Cervix: No lesions  Anus: Normal exam.  No lesions.  Perineum: Normal exam.  No lesions.        Bimanual   Uterus: Normal size  Adnexae: No masses.  Non-tender to palpation.    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 Pre-op exam   2. Cystocele with uterine descensus   3. Incontinence of urine in female   4. Pelvic relaxation disorder   5. Pessary maintenance      Plan:    Orders: No orders of the defined types were placed in this encounter.    1.  LAVH, BSO, anterior and posterior repair, TOT  Pre-op discussions regarding Risks and Benefits of her scheduled surgery.  LAVH The procedure of Laparoscopic Assisted Vaginal Hysterectomy was described to the patient in detail.  We reviewed the rationale for Hysterectomy and the patient was again informed of other nonsurgical management possibilities  for her condition.  She has considered these other options, and desires a Hysterectomy.  We have reviewed the fact that Hysterectomy is permanent and that following the procedure she will not be able to become pregnant or bear children.  We have discussed the following risk factors specifically and the patient has also been informed that additional complications not mentioned may develop:  Damage to bowel, bladder, ureters or to other internal organs, bleeding, infection and the risk from anesthesia.  We have discussed the procedure itself in detail and she has an informed understanding of this surgery.  We have also discussed the recovery period in which physical and sexual activity will be restricted for a varying degree of time, often 3 - 6 weeks. The Laparoscopic Portion of Hysterectomy has also been reviewed with the patient.  She understands how the laparoscope facilitates the procedure.  We have discussed the abdominal incisions and punctures that will be used.  We have also reviewed the increased Operating Room time often accompanying LAVH.  The slightly increased risk of complications secondary to abdominal punctures, and use of laparoscopic instrumentation has also been discussed in detail.I have answered all of her questions and I believe the patient has an informed understanding of the procedure of Laparoscopic Assisted Vaginal Hysterectomy. Oophorectomy The option of Oophorectomy has been discussed with the patient.  Detailed risk/benefits have been reviewed.  The risks discussed include, but are not limited to, hemorrhage, infection, damage to ureter or other internal organ, and Ovarian Remnant Syndrome.  The benefits include a significant decrease in the risk of Ovarian Cancer and in benign Ovarian disease.  The risk of Ovarian CA has been estimated at 1 in 70.  This is a relatively small risk.  However, should Ovarian CA develop, it is often found late in the course of the disease.  We have also  discussed the role of inheritance in the development of Ovarian disease.  Some women, who have close relatives with Ovarian CA, have a higher than 1 in 70 risk of Ovarian CA.  The benefits of Estrogen replacement therapy following Oophorectomy has been stressed.  If she is premenopausal, we have discussed the fact that this procedure will make her permanently sterile and that premature menopause will result if no ERT is  begun.  I have answered all of her questions, and I believe that she has an adequate and informed understanding of the risks and benefits of Oophorectomy. Anterior Repair I have discussed the procedure of anterior repair and Kelly placation.  I have informed the patient that this procedure often corrects or improves stress urinary incontinence, but that there is certainly no guarantee of her improvement.  The procedure itself was discussed.  The possible damage to the bowel, ureters or urethra was also discussed.  We have reviewed the repositioning of the bladder that often takes place at anterior repair and I have informed her that although unlikely, it is possible that a worsening of her incontinence could occur after this procedure.  I have also discussed with her the necessity of decreased lifting and physical activity following the procedure as well as the possibility that as she gets older, her stress urinary incontinence could slowly return.  The use of vaginal Estrogen or oral Estrogen as well as other medications in the role of both stress and bladder dysynergia incontinence were discussed.  I have discussed the complication of inability to void immediately following the procedure.  The patient is aware that she may go home using a Foley catheter or may be taught the technique of self-catheterization should a complication develop.  All of her questions have been answered and I believe that she has an informed understanding of anterior repair/Kelly plication. Posterior Repair Posterior  repair was discussed with the patient.  The risks were reviewed and include:  possible damage to rectum and bowel, bleeding, infection and anesthesia.  The benefits were also discussed.  Post-op recovery with special attention to hospital stay and return to sexual function were specifically reviewed.  All her questions were answered, and I believe that she has an informed understanding of Posterior repair.  TOT I have discussed the procedure of tension free vaginal tape using the trans-obturator approach. (TOT).  I have informed the patient that this procedure often corrects or improves stress urinary incontinence.  All the patient has been made aware that there is no guarantee of her improvement or the length of time her improvement will last.  The procedure itself was discussed in detail including possible damage to bowel, ureters, urethra and bladder.  I have informed her that the mesh is permanent.  The risk of extrusion of the mesh has also been reviewed. The management of this complication has been discussed.  The risks of bleeding and infection were also reviewed.  I have specifically discussed the complication of inability to void following the procedure and the patient is aware that she may go home using a Foley catheter or using a self-catheterization technique.  Possibility of home catheter removal discussed and literature given. In addition, I have discussed with the patient the use of cystoscopy to diagnose bladder injury should there be any question of this occurrence.   Elonda Husky, M.D. 03/24/2023 11:48 AM

## 2023-03-24 NOTE — Progress Notes (Signed)
 Patient presents today for a pre-op exam. She states no additional concerns today.

## 2023-03-24 NOTE — H&P (Signed)
 PRE-OPERATIVE HISTORY AND PHYSICAL EXAM  PCP:  Marisue Ivan, MD Subjective:   HPI:  Christina Cuevas is a 83 y.o. [id number].  No LMP recorded. Patient is postmenopausal.  She presents today for a pre-op discussion and PE.  She has the following symptoms: Pelvic relaxation, stress urinary incontinence  She has been using a pessary and oxybutynin.  She does not like the pessary.  She has also continued to experience significant urine loss despite the pessary and despite use of oxybutynin.  She is requesting definitive surgery.  Review of Systems:   Constitutional: Denied constitutional symptoms, night sweats, recent illness, fatigue, fever, insomnia and weight loss.  Eyes: Denied eye symptoms, eye pain, photophobia, vision change and visual disturbance.  Ears/Nose/Throat/Neck: Denied ear, nose, throat or neck symptoms, hearing loss, nasal discharge, sinus congestion and sore throat.  Cardiovascular: Denied cardiovascular symptoms, arrhythmia, chest pain/pressure, edema, exercise intolerance, orthopnea and palpitations.  Respiratory: Denied pulmonary symptoms, asthma, pleuritic pain, productive sputum, cough, dyspnea and wheezing.  Gastrointestinal: Denied, gastro-esophageal reflux, melena, nausea and vomiting.  Genitourinary: See HPI for additional information.  Musculoskeletal: Denied musculoskeletal symptoms, stiffness, swelling, muscle weakness and myalgia.  Dermatologic: Denied dermatology symptoms, rash and scar.  Neurologic: Denied neurology symptoms, dizziness, headache, neck pain and syncope.  Psychiatric: Denied psychiatric symptoms, anxiety and depression.  Endocrine: Denied endocrine symptoms including hot flashes and night sweats.   OB History  Gravida Para Term Preterm AB Living  3 3 3   3   SAB IAB Ectopic Multiple Live Births          # Outcome Date GA Lbr Len/2nd Weight Sex Type Anes PTL Lv  3 Term           2 Term           1 Term             Past Medical  History:  Diagnosis Date   Actinic keratosis    Basal cell carcinoma 05/18/2006   Left pretibia   Breast cancer (HCC) 2011   left breast cancer treated with radiation   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 Slight to moderate atypia   Dysplastic nevus 01/18/2007   Right sup lat thigh. Moderate atypia, extends to one edge.   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 Excised 01/10/2007, residual MIS, edges free   Pre-diabetes    diet controlled   Type 2 diabetes mellitus without complication, without long-term current use of insulin (HCC) 12/20/2017    Past Surgical History:  Procedure Laterality Date   APPENDECTOMY  1950   BREAST LUMPECTOMY Left 02/2009   lymph nodes removed   BREAST SURGERY Right 02/2009   sclerosing lesion removed at same time   BUNIONECTOMY Left 03/25/2018   Procedure: DOUBLE OSTEOTOMY;  Surgeon: Gwyneth Revels, DPM;  Location: [facility];  Service: Podiatry;  Laterality: Left;   COLONOSCOPY        SOCIAL HISTORY:  Social History   Tobacco Use  Smoking Status Never  Smokeless Tobacco Never   Social History   Substance and Sexual Activity  Alcohol Use No    Social History   Substance and Sexual Activity  Drug Use No    Family History  Problem Relation Age of Onset   Parkinson's disease Mother    Pancreatic cancer Father    Cancer Paternal Uncle        2 uncles    Migraines Maternal Grandmother    Breast cancer Cousin     ALLERGIES:  Floxacillin (flucloxacillin), Cephalexin, Ciprofloxacin, and Metronidazole  MEDS:   Current Outpatient Medications on File Prior to Visit  Medication Sig Dispense Refill   atorvastatin (LIPITOR) 20 MG tablet Take 20 mg by mouth at bedtime.     cetirizine (ZYRTEC) 10 MG tablet Take 10 mg by mouth daily.       Cholecalciferol 25 MCG (1000 UT) tablet Take 4,000 Units by mouth 2 (two) times daily. 2000 mg in morning and again at night     DULoxetine (CYMBALTA) 30 MG capsule Take 30 mg by mouth daily.      estradiol (ESTRACE) 0.1 MG/GM vaginal cream Place 0.25 Applicatorfuls vaginally at bedtime. 90 g 3   levothyroxine (SYNTHROID, LEVOTHROID) 88 MCG tablet Take 88 mcg by mouth daily before breakfast.      lisinopril (PRINIVIL,ZESTRIL) 10 MG tablet Take 5 mg by mouth daily.     loperamide (IMODIUM) 2 MG capsule Take 4-6 mg by mouth as needed for diarrhea or loose stools.     ondansetron (ZOFRAN-ODT) 4 MG disintegrating tablet Take 1 tablet (4 mg total) by mouth every 6 (six) hours as needed for nausea or vomiting. 20 tablet 0   oxybutynin (DITROPAN XL) 10 MG 24 hr tablet Take 1 tablet (10 mg total) by mouth at bedtime. 90 tablet 0   OXYQUINOLONE SULFATE VAGINAL (TRIMO-SAN) 0.025 % GEL INSERT 1/3 APPLICATORFUL VAGINALLY EVERY OTHER DAY AS DIRECTED 113 g 2   pantoprazole (PROTONIX) 40 MG tablet Take 40 mg by mouth daily.     simethicone (MYLICON) 80 MG chewable tablet Chew 80 mg by mouth daily.     No current facility-administered medications on file prior to visit.    No orders of the defined types were placed in this encounter.   BP 99/62   Pulse 66   Ht 5\' 1"  (1.549 m)   Wt 146 lb 1.6 oz (66.3 kg)   BMI 27.61 kg/m   Physical examination General NAD, Conversant  HEENT Atraumatic; Op clear with mmm.  Normo-cephalic.  Anicteric sclerae  Thyroid/Neck Smooth without nodularity or enlargement. Normal ROM.  Neck Supple.  Skin No rashes, lesions or ulceration. Normal palpated skin turgor. No nodularity.  Breasts: No masses or discharge.  Symmetric.  No axillary adenopathy.  Lungs: Clear to auscultation.No rales or wheezes. Normal Respiratory effort, no retractions.  Heart: NSR.  No murmurs or rubs appreciated. No peripheral edema  Abdomen: Soft.  Non-tender.  No masses.  No HSM. No hernia   Extremities: Moves all appropriately.  Normal ROM for age. No lymphadenopathy.  Neuro: Oriented to PPT.  Normal mood. Normal affect.     Pelvic:   Vulva: Normal appearance.  No lesions.  Vagina: No lesions or abnormalities noted.  Pessary in place  Support: Third-degree cystocele second-degree rectocele with uterine descensus.  Urethra No masses tenderness or scarring.  Meatus Normal size without lesions or prolapse.  Cervix: No lesions  Anus: Normal exam.  No lesions.  Perineum: Normal exam.  No lesions.        Bimanual   Uterus: Normal size  Adnexae: No masses.  Non-tender to palpation.    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 Pre-op exam   2. Cystocele with uterine descensus   3. Incontinence of urine in female   4. Pelvic relaxation disorder   5. Pessary maintenance      Plan:    Orders: No orders of the defined types were placed in this encounter.    1.  LAVH, BSO, anterior and posterior repair, TOT

## 2023-04-13 ENCOUNTER — Other Ambulatory Visit

## 2023-04-16 ENCOUNTER — Encounter
Admission: RE | Admit: 2023-04-16 | Discharge: 2023-04-16 | Disposition: A | Discharge status: Home / Self Care | Source: Ambulatory Visit | Attending: Obstetrics and Gynecology | Admitting: Obstetrics and Gynecology

## 2023-04-16 ENCOUNTER — Other Ambulatory Visit: Payer: Self-pay

## 2023-04-16 DIAGNOSIS — E119 Type 2 diabetes mellitus without complications: Secondary | ICD-10-CM

## 2023-04-16 DIAGNOSIS — E1142 Type 2 diabetes mellitus with diabetic polyneuropathy: Secondary | ICD-10-CM

## 2023-04-16 DIAGNOSIS — I10 Essential (primary) hypertension: Secondary | ICD-10-CM

## 2023-04-16 HISTORY — DX: Mild cognitive impairment of uncertain or unknown etiology: G31.84

## 2023-04-16 HISTORY — DX: Other allergy status, other than to drugs and biological substances: Z91.09

## 2023-04-16 HISTORY — DX: Uterovaginal prolapse, unspecified: N81.4

## 2023-04-16 HISTORY — DX: Complete uterovaginal prolapse: N81.3

## 2023-04-16 HISTORY — DX: Other emphysema: J43.8

## 2023-04-16 HISTORY — DX: Chronic kidney disease, stage 3a: N18.31

## 2023-04-16 HISTORY — DX: Stress incontinence (female) (male): N39.3

## 2023-04-16 NOTE — Patient Instructions (Addendum)
 Your procedure is scheduled on: 04/26/23 - Monday Report to the Registration Desk on the 1st floor of the [facility] : [address] [place] To find out your arrival time, please call [phone] between 1PM - 3PM on: 04/23/23 - Friday If your arrival time is 6:00 am, do not arrive before that time as the [facility] entrance doors do not open until 6:00 am.  REMEMBER: Instructions that are not followed completely may result in serious medical risk, up to and including death; or upon the discretion of your surgeon and anesthesiologist your surgery may need to be rescheduled.  Do not eat food or drink any liquids after midnight the night before surgery.  No gum chewing or hard candies.  One week prior to surgery: Stop Anti-inflammatories (NSAIDS) such as Advil, Aleve, Ibuprofen, Motrin, Naproxen, Naprosyn and Aspirin based products such as Excedrin, Goody's Powder, BC Powder. You may take Tylenol if needed for pain up until the day of surgery.  Stop ANY OVER THE COUNTER supplements until after surgery  HOLD lisinopril (PRINIVIL,ZESTRIL) on the day of surgery.  metroNIDAZOLE (FLAGYL) - Take 1 tablet (500 mg total) by mouth 2 (two) times daily. Begin 5 days prior to scheduled surgery as directed.   ON THE DAY OF SURGERY ONLY TAKE THESE MEDICATIONS WITH SIPS OF WATER:  DULoxetine (CYMBALTA)  levothyroxine (SYNTHROID) pantoprazole (PROTONIX)    No Alcohol for 24 hours before or after surgery.  No Smoking including e-cigarettes for 24 hours before surgery.  No chewable tobacco products for at least 6 hours before surgery.  No nicotine patches on the day of surgery.  Do not use any "recreational" drugs for at least a week (preferably 2 weeks) before your surgery.  Please be advised that the combination of cocaine and anesthesia may have negative outcomes, up to and including death. If you test positive for cocaine, your surgery will be cancelled.  On the morning of  surgery brush your teeth with toothpaste and water, you may rinse your mouth with mouthwash if you wish. Do not swallow any toothpaste or mouthwash.  Use CHG Soap or wipes as directed on instruction sheet.  Do not wear jewelry, make-up, hairpins, clips or nail polish.  For welded (permanent) jewelry: bracelets, anklets, waist bands, etc.  Please have this removed prior to surgery.  If it is not removed, there is a chance that hospital personnel will need to cut it off on the day of surgery.  Do not wear lotions, powders, or perfumes.   Do not shave body hair from the neck down 48 hours before surgery.  Contact lenses, hearing aids and dentures may not be worn into surgery.  Do not bring valuables to the hospital. [facility] is not responsible for any missing/lost belongings or valuables.   Notify your doctor if there is any change in your medical condition (cold, fever, infection).  Wear comfortable clothing (specific to your surgery type) to the hospital.  After surgery, you can help prevent lung complications by doing breathing exercises.  Take deep breaths and cough every 1-2 hours. Your doctor may order a device called an Incentive Spirometer to help you take deep breaths.  When coughing or sneezing, hold a pillow firmly against your incision with both hands. This is called "splinting." Doing this helps protect your incision. It also decreases belly discomfort.  If you are being admitted to the hospital overnight, leave your suitcase in the car. After surgery it may be brought to your  room.  In case of increased patient census, it may be necessary for you, the patient, to continue your postoperative care in the Same Day Surgery department.  If you are being discharged the day of surgery, you will not be allowed to drive home. You will need a responsible individual to drive you home and stay with you for 24 hours after surgery.   If you are taking public transportation, you will  need to have a responsible individual with you.  Please call the Pre-admissions Testing Dept. at [phone] if you have any questions about these instructions.  Surgery Visitation Policy:  Patients having surgery or a procedure may have two visitors.  Children under the age of 38 must have an adult with them who is not the patient.  Inpatient Visitation:    Visiting hours are 7 a.m. to 8 p.m. Up to four visitors are allowed at one time in a patient room. The visitors may rotate out with other people during the day.  One visitor age 44 or older may stay with the patient overnight and must be in the room by 8 p.m.     Preparing for Surgery with CHLORHEXIDINE GLUCONATE (CHG) Soap  Chlorhexidine Gluconate (CHG) Soap  o An antiseptic cleaner that kills germs and bonds with the skin to continue killing germs even after washing  o Used for showering the night before surgery and morning of surgery  Before surgery, you can play an important role by reducing the number of germs on your skin.  CHG (Chlorhexidine gluconate) soap is an antiseptic cleanser which kills germs and bonds with the skin to continue killing germs even after washing.  Please do not use if you have an allergy to CHG or antibacterial soaps. If your skin becomes reddened/irritated stop using the CHG.  1. Shower the NIGHT BEFORE SURGERY and the MORNING OF SURGERY with CHG soap.  2. If you choose to wash your hair, wash your hair first as usual with your normal shampoo.  3. After shampooing, rinse your hair and body thoroughly to remove the shampoo.  4. Use CHG as you would any other liquid soap. You can apply CHG directly to the skin and wash gently with a scrungie or a clean washcloth.  5. Apply the CHG soap to your body only from the neck down. Do not use on open wounds or open sores. Avoid contact with your eyes, ears, mouth, and genitals (private parts). Wash face and genitals (private parts) with your normal  soap.  6. Wash thoroughly, paying special attention to the area where your surgery will be performed.  7. Thoroughly rinse your body with warm water.  8. Do not shower/wash with your normal soap after using and rinsing off the CHG soap.  9. Pat yourself dry with a clean towel.  10. Wear clean pajamas to bed the night before surgery.  12. Place clean sheets on your bed the night of your first shower and do not sleep with pets.  13. Shower again with the CHG soap on the day of surgery prior to arriving at the hospital.  14. Do not apply any deodorants/lotions/powders.  15. Please wear clean clothes to the hospital.

## 2023-04-19 ENCOUNTER — Encounter
Admission: RE | Admit: 2023-04-19 | Discharge: 2023-04-19 | Disposition: A | Discharge status: Home / Self Care | Source: Ambulatory Visit | Attending: Obstetrics and Gynecology | Admitting: Obstetrics and Gynecology

## 2023-04-19 DIAGNOSIS — Z01810 Encounter for preprocedural cardiovascular examination: Secondary | ICD-10-CM | POA: Diagnosis not present

## 2023-04-19 DIAGNOSIS — E119 Type 2 diabetes mellitus without complications: Secondary | ICD-10-CM

## 2023-04-19 DIAGNOSIS — E1142 Type 2 diabetes mellitus with diabetic polyneuropathy: Secondary | ICD-10-CM | POA: Diagnosis not present

## 2023-04-19 DIAGNOSIS — Z01818 Encounter for other preprocedural examination: Secondary | ICD-10-CM | POA: Diagnosis not present

## 2023-04-19 DIAGNOSIS — I10 Essential (primary) hypertension: Secondary | ICD-10-CM | POA: Diagnosis not present

## 2023-04-19 DIAGNOSIS — Z01812 Encounter for preprocedural laboratory examination: Secondary | ICD-10-CM | POA: Diagnosis present

## 2023-04-19 LAB — TYPE AND SCREEN
ABO/RH(D): A POS
Antibody Screen: NEGATIVE

## 2023-04-20 ENCOUNTER — Inpatient Hospital Stay: Admission: RE | Admit: 2023-04-20 | Source: Ambulatory Visit

## 2023-04-25 ENCOUNTER — Emergency Department
Admission: EM | Admit: 2023-04-25 | Discharge: 2023-04-25 | Disposition: A | Discharge status: Home / Self Care | Attending: Emergency Medicine | Admitting: Emergency Medicine

## 2023-04-25 ENCOUNTER — Other Ambulatory Visit: Payer: Self-pay

## 2023-04-25 DIAGNOSIS — R300 Dysuria: Secondary | ICD-10-CM | POA: Insufficient documentation

## 2023-04-25 DIAGNOSIS — R350 Frequency of micturition: Secondary | ICD-10-CM | POA: Insufficient documentation

## 2023-04-25 DIAGNOSIS — N390 Urinary tract infection, site not specified: Secondary | ICD-10-CM

## 2023-04-25 DIAGNOSIS — N3000 Acute cystitis without hematuria: Secondary | ICD-10-CM

## 2023-04-25 HISTORY — DX: Urinary tract infection, site not specified: N39.0

## 2023-04-25 LAB — URINALYSIS, ROUTINE W REFLEX MICROSCOPIC
Bacteria, UA: NONE SEEN
Bilirubin Urine: NEGATIVE
Glucose, UA: NEGATIVE mg/dL
Ketones, ur: NEGATIVE mg/dL
Nitrite: NEGATIVE
Protein, ur: 100 mg/dL — AB
RBC / HPF: >50 RBC/hpf (ref 0–5)
Specific Gravity, Urine: 1.019 (ref 1.005–1.030)
WBC, UA: >50 WBC/hpf (ref 0–5)
pH: 5 (ref 5.0–8.0)

## 2023-04-25 MED ORDER — ORAL CARE MOUTH RINSE: 15.0000 mL | Freq: Once | OROMUCOSAL | Status: AC

## 2023-04-25 MED ORDER — SODIUM CHLORIDE 0.9% FLUSH: 3.0000 mL | INTRAVENOUS | Status: DC | PRN

## 2023-04-25 MED ORDER — GABAPENTIN 300 MG PO CAPS
300.0000 mg | ORAL_CAPSULE | ORAL | Status: AC
  Administered 2023-04-26: 300 mg via ORAL

## 2023-04-25 MED ORDER — NITROFURANTOIN MONOHYD MACRO 100 MG PO CAPS: 100.0000 mg | ORAL_CAPSULE | Freq: Two times a day (BID) | ORAL | 0 refills | Status: AC

## 2023-04-25 MED ORDER — CHLORHEXIDINE GLUCONATE 0.12 % MT SOLN
15.0000 mL | Freq: Once | OROMUCOSAL | Status: AC
  Administered 2023-04-26: 15 mL via OROMUCOSAL

## 2023-04-25 MED ORDER — CEFAZOLIN SODIUM-DEXTROSE 2-4 GM/100ML-% IV SOLN
2.0000 g | INTRAVENOUS | Status: AC
  Administered 2023-04-26: 2 g via INTRAVENOUS

## 2023-04-25 MED ORDER — SODIUM CHLORIDE 0.9% FLUSH: 3.0000 mL | Freq: Two times a day (BID) | INTRAVENOUS | Status: DC

## 2023-04-25 MED ORDER — ACETAMINOPHEN 500 MG PO TABS
1000.0000 mg | ORAL_TABLET | ORAL | Status: AC
  Administered 2023-04-26: 1000 mg via ORAL

## 2023-04-25 MED ORDER — PHENAZOPYRIDINE HCL 100 MG PO TABS: 100.0000 mg | ORAL_TABLET | Freq: Three times a day (TID) | ORAL | 0 refills | Status: AC | PRN

## 2023-04-25 MED ORDER — NITROFURANTOIN MONOHYD MACRO 100 MG PO CAPS
100.0000 mg | ORAL_CAPSULE | Freq: Once | ORAL | Status: AC
  Administered 2023-04-25: 100 mg via ORAL
  Filled 2023-04-25: qty 1

## 2023-04-25 MED ORDER — CELECOXIB 200 MG PO CAPS
400.0000 mg | ORAL_CAPSULE | ORAL | Status: AC
  Administered 2023-04-26: 400 mg via ORAL

## 2023-04-25 MED ORDER — PHENAZOPYRIDINE HCL 100 MG PO TABS
95.0000 mg | ORAL_TABLET | Freq: Once | ORAL | Status: AC
  Administered 2023-04-25: 100 mg via ORAL
  Filled 2023-04-25: qty 1

## 2023-04-25 MED ORDER — POVIDONE-IODINE 10 % EX SWAB: 2.0000 | Freq: Once | CUTANEOUS | Status: DC

## 2023-04-25 NOTE — ED Provider Notes (Signed)
 [facility] Provider Note    Event Date/Time   First MD Initiated Contact with Patient 04/25/23 1636     (approximate)   History   UTI    HPI  Christina Cuevas is a 83 y.o. female      with a past medical history of chronic UTI, who presents to the ED complaining of dysuria, frequency.  Patient denies fever, nauseas, back pain, anorexia.     Physical Exam   Triage Vital Signs: ED Triage Vitals  Encounter Vitals Group     BP 04/25/23 1537 131/81     Systolic BP Percentile --      Diastolic BP Percentile --      Pulse Rate 04/25/23 1537 98     Resp 04/25/23 1537 18     Temp 04/25/23 1537 97.9 F (36.6 C)     Temp Source 04/25/23 1537 Oral     SpO2 04/25/23 1537 99 %     Weight 04/25/23 1538 135 lb (61.2 kg)     Height --      Head Circumference --      Peak Flow --      Pain Score 04/25/23 1538 6     Pain Loc --      Pain Education --      Exclude from Growth Chart --     Most recent vital signs: Vitals:   04/25/23 1537  BP: 131/81  Pulse: 98  Resp: 18  Temp: 97.9 F (36.6 C)  SpO2: 99%     Constitutional: Alert, NAD. Able to speak in complete sentences without cough or dyspnea  Eyes: Conjunctivae are normal.  Head: Atraumatic. Nose: No congestion/rhinnorhea. Mouth/Throat: Mucous membranes are moist.   Neck: Painless ROM. Supple. No JVD, nodes, thyromegaly  Cardiovascular:   Good peripheral circulation.RRR no murmurs, gallops, rubs  Respiratory: Normal respiratory effort.  No retractions. Clear to auscultation bilaterally without wheezing or crackles  Gastrointestinal: Soft and nontender.  Musculoskeletal:  no deformity Neurologic:  MAE spontaneously. No gross focal neurologic deficits are appreciated.  Skin:  Skin is warm, dry and intact. No rash noted. Psychiatric: Mood and affect are normal. Speech and behavior are normal.    ED Results / Procedures / Treatments   Labs (all labs ordered are listed, but only abnormal  results are displayed) Labs Reviewed  URINALYSIS, ROUTINE W REFLEX MICROSCOPIC - Abnormal; Notable for the following components:      Result Value   Color, Urine AMBER (*)    APPearance TURBID (*)    Hgb urine dipstick MODERATE (*)    Protein, ur 100 (*)    Leukocytes,Ua MODERATE (*)    All other components within normal limits     EKG     RADIOLOGY    PROCEDURES:  Critical Care performed:   Procedures   MEDICATIONS ORDERED IN ED: Medications  nitrofurantoin (macrocrystal-monohydrate) (MACROBID) capsule 100 mg (has no administration in time range)  phenazopyridine (PYRIDIUM) tablet 100 mg (has no administration in time range)   Clinical Course as of 04/25/23 1659  Sun Apr 25, 2023  1641 Urinalysis, Routine w reflex microscopic -Urine, Clean Catch(!) 2. [AE]    Clinical Course User Index [AE] Awilda Lennox, PA-C    IMPRESSION / MDM / ASSESSMENT AND PLAN / ED COURSE  I reviewed the triage vital signs and the nursing notes.  Differential diagnosis includes, but is not limited to, UTI, pyelonephritis, cystitis  Patient's presentation is most consistent with acute complicated  illness / injury requiring diagnostic workup.   Patient's diagnosis is consistent with UTI. Labs are  reassuring. I did review the patient's allergies and medications.During admission patient received nitrofurantoin 100 mg, Pyridium 100 mg . The patient is in stable and satisfactory condition for discharge home  Patient will be discharged home with prescriptions for Macrobid, Pyridium. Patient is to follow up with PCP as needed or otherwise directed. Patient is given ED precautions to return to the ED for any worsening or new symptoms. Discussed plan of care with patient, answered all of patient's questions, Patient agreeable to plan of care. Advised patient to take medications according to the instructions on the label. Discussed possible side effects of new medications. Patient verbalized  understanding.    FINAL CLINICAL IMPRESSION(S) / ED DIAGNOSES   Final diagnoses:  None     Rx / DC Orders   ED Discharge Orders          Ordered    nitrofurantoin, macrocrystal-monohydrate, (MACROBID) 100 MG capsule  2 times daily        04/25/23 1655    phenazopyridine (PYRIDIUM) 100 MG tablet  3 times daily PRN        04/25/23 1656             Note:  This document was prepared using Dragon voice recognition software and may include unintentional dictation errors.   Awilda Lennox, PA-C 04/25/23 1659    Bryson Carbine, MD 04/25/23 [phone]

## 2023-04-25 NOTE — Discharge Instructions (Addendum)
 Have been diagnosed with urinary tract infection.  Please take Macrobid 1 capsule every 12 hours for 7 days after main meals, please take Pyridium 1 tablet by mouth every 8 hours/day as needed for pain.  Please drink plenty fluids.  Please make an appointment with your PCP for a follow-up in a week.  You can come back to ED or go to your PCP if you have new symptoms or symptoms worsen.

## 2023-04-25 NOTE — ED Triage Notes (Signed)
 Pt comes to the ER of not feeling well. RN asked pt what that meant and pt sts, I think that I have a UTI. RN asked what s/s pt is having and pt sts " I can't describe it all I know is that I have a UTI and I just dont feel well."

## 2023-04-26 ENCOUNTER — Ambulatory Visit
Admission: RE | Admit: 2023-04-26 | Discharge: 2023-04-26 | Disposition: A | Discharge status: Home / Self Care | Attending: Obstetrics and Gynecology | Admitting: Obstetrics and Gynecology

## 2023-04-26 ENCOUNTER — Encounter
Admission: RE | Disposition: A | Discharge status: Home / Self Care | Payer: Self-pay | Source: Home / Self Care | Attending: Obstetrics and Gynecology

## 2023-04-26 ENCOUNTER — Encounter: Payer: Self-pay | Admitting: Obstetrics and Gynecology

## 2023-04-26 ENCOUNTER — Other Ambulatory Visit: Payer: Self-pay

## 2023-04-26 ENCOUNTER — Ambulatory Visit: Payer: Self-pay | Admitting: Urgent Care

## 2023-04-26 ENCOUNTER — Ambulatory Visit: Admitting: Anesthesiology

## 2023-04-26 DIAGNOSIS — M1990 Unspecified osteoarthritis, unspecified site: Secondary | ICD-10-CM | POA: Diagnosis not present

## 2023-04-26 DIAGNOSIS — N814 Uterovaginal prolapse, unspecified: Secondary | ICD-10-CM

## 2023-04-26 DIAGNOSIS — Z79899 Other long term (current) drug therapy: Secondary | ICD-10-CM | POA: Diagnosis not present

## 2023-04-26 DIAGNOSIS — N83292 Other ovarian cyst, left side: Secondary | ICD-10-CM | POA: Diagnosis not present

## 2023-04-26 DIAGNOSIS — N8189 Other female genital prolapse: Secondary | ICD-10-CM | POA: Insufficient documentation

## 2023-04-26 DIAGNOSIS — N390 Urinary tract infection, site not specified: Secondary | ICD-10-CM | POA: Diagnosis not present

## 2023-04-26 DIAGNOSIS — K219 Gastro-esophageal reflux disease without esophagitis: Secondary | ICD-10-CM | POA: Diagnosis not present

## 2023-04-26 DIAGNOSIS — G35 Multiple sclerosis: Secondary | ICD-10-CM | POA: Diagnosis not present

## 2023-04-26 DIAGNOSIS — E119 Type 2 diabetes mellitus without complications: Secondary | ICD-10-CM

## 2023-04-26 DIAGNOSIS — N838 Other noninflammatory disorders of ovary, fallopian tube and broad ligament: Secondary | ICD-10-CM | POA: Insufficient documentation

## 2023-04-26 DIAGNOSIS — N813 Complete uterovaginal prolapse: Secondary | ICD-10-CM | POA: Insufficient documentation

## 2023-04-26 DIAGNOSIS — G3184 Mild cognitive impairment, so stated: Secondary | ICD-10-CM | POA: Diagnosis not present

## 2023-04-26 DIAGNOSIS — N393 Stress incontinence (female) (male): Secondary | ICD-10-CM | POA: Insufficient documentation

## 2023-04-26 DIAGNOSIS — N840 Polyp of corpus uteri: Secondary | ICD-10-CM | POA: Insufficient documentation

## 2023-04-26 DIAGNOSIS — E1142 Type 2 diabetes mellitus with diabetic polyneuropathy: Secondary | ICD-10-CM

## 2023-04-26 DIAGNOSIS — E039 Hypothyroidism, unspecified: Secondary | ICD-10-CM | POA: Diagnosis not present

## 2023-04-26 DIAGNOSIS — R32 Unspecified urinary incontinence: Secondary | ICD-10-CM | POA: Diagnosis present

## 2023-04-26 DIAGNOSIS — E1122 Type 2 diabetes mellitus with diabetic chronic kidney disease: Secondary | ICD-10-CM | POA: Insufficient documentation

## 2023-04-26 DIAGNOSIS — Z79890 Hormone replacement therapy (postmenopausal): Secondary | ICD-10-CM | POA: Insufficient documentation

## 2023-04-26 DIAGNOSIS — I129 Hypertensive chronic kidney disease with stage 1 through stage 4 chronic kidney disease, or unspecified chronic kidney disease: Secondary | ICD-10-CM | POA: Insufficient documentation

## 2023-04-26 DIAGNOSIS — Z9889 Other specified postprocedural states: Secondary | ICD-10-CM

## 2023-04-26 DIAGNOSIS — N1831 Chronic kidney disease, stage 3a: Secondary | ICD-10-CM | POA: Diagnosis not present

## 2023-04-26 HISTORY — PX: LAPAROSCOPIC VAGINAL HYSTERECTOMY WITH SALPINGO OOPHORECTOMY: SHX6681

## 2023-04-26 HISTORY — PX: BLADDER SUSPENSION: SHX72

## 2023-04-26 HISTORY — PX: CYSTOCELE REPAIR: SHX163

## 2023-04-26 LAB — ABO/RH: ABO/RH(D): A POS

## 2023-04-26 SURGERY — HYSTERECTOMY, VAGINAL, LAPAROSCOPY-ASSISTED, WITH SALPINGO-OOPHORECTOMY
Anesthesia: General | Site: Vagina

## 2023-04-26 MED ORDER — ONDANSETRON HCL 4 MG/2ML IJ SOLN
INTRAMUSCULAR | Status: AC
  Filled 2023-04-26: qty 2

## 2023-04-26 MED ORDER — LIDOCAINE HCL (PF) 2 % IJ SOLN
INTRAMUSCULAR | Status: AC
  Filled 2023-04-26: qty 5

## 2023-04-26 MED ORDER — ONDANSETRON HCL 4 MG/2ML IJ SOLN
INTRAMUSCULAR | Status: DC | PRN
  Administered 2023-04-26: 4 mg via INTRAVENOUS

## 2023-04-26 MED ORDER — ONDANSETRON HCL 4 MG PO TABS: 4.0000 mg | ORAL_TABLET | Freq: Three times a day (TID) | ORAL | 0 refills | Status: AC | PRN

## 2023-04-26 MED ORDER — HYDRALAZINE HCL 20 MG/ML IJ SOLN
INTRAMUSCULAR | Status: AC
  Filled 2023-04-26: qty 1

## 2023-04-26 MED ORDER — DEXAMETHASONE SODIUM PHOSPHATE 10 MG/ML IJ SOLN
INTRAMUSCULAR | Status: DC | PRN
  Administered 2023-04-26: 10 mg via INTRAVENOUS

## 2023-04-26 MED ORDER — BUPIVACAINE HCL (PF) 0.5 % IJ SOLN
INTRAMUSCULAR | Status: AC
  Filled 2023-04-26: qty 30

## 2023-04-26 MED ORDER — FENTANYL CITRATE (PF) 100 MCG/2ML IJ SOLN
INTRAMUSCULAR | Status: AC
  Filled 2023-04-26: qty 2

## 2023-04-26 MED ORDER — SODIUM CHLORIDE 0.9 % IV SOLN: INTRAVENOUS | Status: DC | PRN

## 2023-04-26 MED ORDER — 0.9 % SODIUM CHLORIDE (POUR BTL) OPTIME
TOPICAL | Status: DC | PRN
  Administered 2023-04-26: 300 mL

## 2023-04-26 MED ORDER — OXYCODONE HCL 5 MG PO TABS: 5.0000 mg | ORAL_TABLET | Freq: Once | ORAL | Status: DC | PRN

## 2023-04-26 MED ORDER — OXYCODONE HCL 5 MG/5ML PO SOLN: 5.0000 mg | Freq: Once | ORAL | Status: DC | PRN

## 2023-04-26 MED ORDER — LACTATED RINGERS IV SOLN: INTRAVENOUS | Status: DC

## 2023-04-26 MED ORDER — DEXMEDETOMIDINE HCL IN NACL 80 MCG/20ML IV SOLN
INTRAVENOUS | Status: DC | PRN
  Administered 2023-04-26: 4 ug via INTRAVENOUS
  Administered 2023-04-26: 8 ug via INTRAVENOUS

## 2023-04-26 MED ORDER — ACETAMINOPHEN 10 MG/ML IV SOLN: 1000.0000 mg | Freq: Once | INTRAVENOUS | Status: DC | PRN

## 2023-04-26 MED ORDER — CEFAZOLIN SODIUM-DEXTROSE 2-4 GM/100ML-% IV SOLN
INTRAVENOUS | Status: AC
  Filled 2023-04-26: qty 100

## 2023-04-26 MED ORDER — VASOPRESSIN 20 UNIT/ML IV SOLN
INTRAVENOUS | Status: DC | PRN
  Administered 2023-04-26: 14 mL via INTRAMUSCULAR
  Administered 2023-04-26: 16 mL via INTRAMUSCULAR

## 2023-04-26 MED ORDER — SUGAMMADEX SODIUM 200 MG/2ML IV SOLN
INTRAVENOUS | Status: DC | PRN
  Administered 2023-04-26: 140 mg via INTRAVENOUS

## 2023-04-26 MED ORDER — LIDOCAINE HCL (CARDIAC) PF 100 MG/5ML IV SOSY
PREFILLED_SYRINGE | INTRAVENOUS | Status: DC | PRN
  Administered 2023-04-26: 100 mg via INTRAVENOUS

## 2023-04-26 MED ORDER — GABAPENTIN 300 MG PO CAPS
ORAL_CAPSULE | ORAL | Status: AC
  Filled 2023-04-26: qty 1

## 2023-04-26 MED ORDER — BUPIVACAINE HCL 0.5 % IJ SOLN
INTRAMUSCULAR | Status: DC | PRN
  Administered 2023-04-26: 16 mL

## 2023-04-26 MED ORDER — EPHEDRINE SULFATE-NACL 50-0.9 MG/10ML-% IV SOSY
PREFILLED_SYRINGE | INTRAVENOUS | Status: DC | PRN
  Administered 2023-04-26: 10 mg via INTRAVENOUS

## 2023-04-26 MED ORDER — SODIUM CHLORIDE (PF) 0.9 % IJ SOLN
INTRAMUSCULAR | Status: AC
  Filled 2023-04-26: qty 50

## 2023-04-26 MED ORDER — HYDROCODONE-ACETAMINOPHEN 5-325 MG PO TABS
1.0000 | ORAL_TABLET | Freq: Four times a day (QID) | ORAL | 0 refills | Status: DC | PRN
Start: 2023-04-26 — End: 2023-05-04

## 2023-04-26 MED ORDER — ESTROGENS CONJUGATED 0.625 MG/GM VA CREA
TOPICAL_CREAM | VAGINAL | Status: AC
  Filled 2023-04-26: qty 30

## 2023-04-26 MED ORDER — DEXAMETHASONE SODIUM PHOSPHATE 10 MG/ML IJ SOLN
INTRAMUSCULAR | Status: AC
  Filled 2023-04-26: qty 1

## 2023-04-26 MED ORDER — FENTANYL CITRATE (PF) 100 MCG/2ML IJ SOLN: 25.0000 ug | INTRAMUSCULAR | Status: DC | PRN

## 2023-04-26 MED ORDER — ONDANSETRON HCL 4 MG/2ML IJ SOLN
4.0000 mg | Freq: Once | INTRAMUSCULAR | Status: AC | PRN
  Administered 2023-04-26: 4 mg via INTRAVENOUS

## 2023-04-26 MED ORDER — ROCURONIUM BROMIDE 10 MG/ML (PF) SYRINGE
PREFILLED_SYRINGE | INTRAVENOUS | Status: AC
  Filled 2023-04-26: qty 10

## 2023-04-26 MED ORDER — ACETAMINOPHEN 500 MG PO TABS
ORAL_TABLET | ORAL | Status: AC
  Filled 2023-04-26: qty 2

## 2023-04-26 MED ORDER — CHLORHEXIDINE GLUCONATE 0.12 % MT SOLN
OROMUCOSAL | Status: AC
  Filled 2023-04-26: qty 15

## 2023-04-26 MED ORDER — HYDRALAZINE HCL 20 MG/ML IJ SOLN
10.0000 mg | Freq: Once | INTRAMUSCULAR | Status: AC
Start: 2023-04-26 — End: 2023-04-26
  Administered 2023-04-26: 10 mg via INTRAVENOUS
  Filled 2023-04-26: qty 0.5

## 2023-04-26 MED ORDER — SEVOFLURANE IN SOLN
RESPIRATORY_TRACT | Status: AC
  Filled 2023-04-26: qty 250

## 2023-04-26 MED ORDER — PROPOFOL 10 MG/ML IV BOLUS
INTRAVENOUS | Status: DC | PRN
  Administered 2023-04-26: 80 mg via INTRAVENOUS

## 2023-04-26 MED ORDER — PHENYLEPHRINE 80 MCG/ML (10ML) SYRINGE FOR IV PUSH (FOR BLOOD PRESSURE SUPPORT)
PREFILLED_SYRINGE | INTRAVENOUS | Status: AC
  Filled 2023-04-26: qty 10

## 2023-04-26 MED ORDER — FENTANYL CITRATE (PF) 100 MCG/2ML IJ SOLN
INTRAMUSCULAR | Status: DC | PRN
  Administered 2023-04-26 (×4): 25 ug via INTRAVENOUS

## 2023-04-26 MED ORDER — ROCURONIUM BROMIDE 100 MG/10ML IV SOLN
INTRAVENOUS | Status: DC | PRN
  Administered 2023-04-26: 60 mg via INTRAVENOUS
  Administered 2023-04-26: 10 mg via INTRAVENOUS

## 2023-04-26 MED ORDER — DEXMEDETOMIDINE HCL IN NACL 80 MCG/20ML IV SOLN
INTRAVENOUS | Status: AC
  Filled 2023-04-26: qty 20

## 2023-04-26 MED ORDER — GLYCOPYRROLATE 0.2 MG/ML IJ SOLN
INTRAMUSCULAR | Status: DC | PRN
Start: 2023-04-26 — End: 2023-04-26
  Administered 2023-04-26: .2 mg via INTRAVENOUS

## 2023-04-26 MED ORDER — PROPOFOL 10 MG/ML IV BOLUS
INTRAVENOUS | Status: AC
  Filled 2023-04-26: qty 20

## 2023-04-26 MED ORDER — EPHEDRINE 5 MG/ML INJ
INTRAVENOUS | Status: AC
  Filled 2023-04-26: qty 5

## 2023-04-26 MED ORDER — VASOPRESSIN 20 UNIT/ML IV SOLN
INTRAVENOUS | Status: AC
  Filled 2023-04-26: qty 1

## 2023-04-26 MED ORDER — PHENYLEPHRINE 80 MCG/ML (10ML) SYRINGE FOR IV PUSH (FOR BLOOD PRESSURE SUPPORT)
PREFILLED_SYRINGE | INTRAVENOUS | Status: DC | PRN
  Administered 2023-04-26 (×2): 80 ug via INTRAVENOUS

## 2023-04-26 MED ORDER — CELECOXIB 200 MG PO CAPS
ORAL_CAPSULE | ORAL | Status: AC
  Filled 2023-04-26: qty 2

## 2023-04-26 MED ORDER — GLYCOPYRROLATE 0.2 MG/ML IJ SOLN
INTRAMUSCULAR | Status: AC
  Filled 2023-04-26: qty 1

## 2023-04-26 SURGICAL SUPPLY — 56 items
BAG DECANTER FOR FLEXI CONT (MISCELLANEOUS) ×3 IMPLANT
BAG URINE DRAIN 2000ML AR STRL (UROLOGICAL SUPPLIES) ×3 IMPLANT
BAG URINE DRAIN UROCATCH STRL (MISCELLANEOUS) ×3 IMPLANT
BLADE SURG 15 STRL LF DISP TIS (BLADE) ×3 IMPLANT
BLADE SURG SZ10 CARB STEEL (BLADE) ×3 IMPLANT
BLADE SURG SZ11 CARB STEEL (BLADE) ×3 IMPLANT
BNDG COHESIVE 6X5 TAN ST LF (GAUZE/BANDAGES/DRESSINGS) IMPLANT
CATH FOLEY 2WAY SIL 16X30 (CATHETERS) ×3 IMPLANT
CHLORAPREP W/TINT 26 (MISCELLANEOUS) ×3 IMPLANT
DERMABOND ADVANCED .7 DNX12 (GAUZE/BANDAGES/DRESSINGS) ×3 IMPLANT
DRAPE PERI LITHO V/GYN (MISCELLANEOUS) ×3 IMPLANT
DRAPE UNDER BUTTOCK W/FLU (DRAPES) ×3 IMPLANT
DRAPE UTILITY 15X26 TOWEL STRL (DRAPES) ×3 IMPLANT
ELECT CAUTERY BLADE 6.4 (BLADE) ×3 IMPLANT
ELECT REM PT RETURN 9FT ADLT (ELECTROSURGICAL) ×3 IMPLANT
ELECTRODE REM PT RTRN 9FT ADLT (ELECTROSURGICAL) ×3 IMPLANT
GAUZE 4X4 16PLY ~~LOC~~+RFID DBL (SPONGE) ×12 IMPLANT
GAUZE PACK 2X3YD (PACKING) ×3 IMPLANT
GLOVE BIO SURGEON STRL SZ 6.5 (GLOVE) ×3 IMPLANT
GLOVE BIO SURGEON STRL SZ8 (GLOVE) ×3 IMPLANT
GLOVE INDICATOR 7.0 STRL GRN (GLOVE) ×3 IMPLANT
GLOVE PI ORTHO PRO STRL 7.5 (GLOVE) ×6 IMPLANT
GOWN STRL REUS W/ TWL LRG LVL3 (GOWN DISPOSABLE) ×6 IMPLANT
GOWN STRL REUS W/ TWL XL LVL3 (GOWN DISPOSABLE) ×6 IMPLANT
KIT PINK PAD W/HEAD ARE REST (MISCELLANEOUS) ×3 IMPLANT
KIT PINK PAD W/HEAD ARM REST (MISCELLANEOUS) ×3 IMPLANT
KIT TURNOVER CYSTO (KITS) ×3 IMPLANT
LIGASURE LAP MARYLAND 5MM 37CM (ELECTROSURGICAL) IMPLANT
MANIFOLD NEPTUNE II (INSTRUMENTS) ×3 IMPLANT
NDL HYPO 22X1.5 SAFETY MO (MISCELLANEOUS) ×3 IMPLANT
NDL SAFETY ECLIPSE 18X1.5 (NEEDLE) ×3 IMPLANT
NDL SPNL 22GX3.5 QUINCKE BK (NEEDLE) ×3 IMPLANT
NEEDLE HYPO 22X1.5 SAFETY MO (MISCELLANEOUS) ×3 IMPLANT
NEEDLE SPNL 22GX3.5 QUINCKE BK (NEEDLE) ×3 IMPLANT
NS IRRIG 500ML POUR BTL (IV SOLUTION) ×3 IMPLANT
PACK BASIN MINOR ARMC (MISCELLANEOUS) ×3 IMPLANT
PACK GYN LAPAROSCOPIC (MISCELLANEOUS) ×3 IMPLANT
PAD PREP OB/GYN DISP 24X41 (PERSONAL CARE ITEMS) ×3 IMPLANT
SCRUB CHG 4% DYNA-HEX 4OZ (MISCELLANEOUS) ×3 IMPLANT
SLEEVE Z-THREAD 5X100MM (TROCAR) ×6 IMPLANT
SLING TRANSOBTURATOR OBTRYX (Sling) ×3 IMPLANT
SURGILUBE 2OZ TUBE FLIPTOP (MISCELLANEOUS) ×3 IMPLANT
SUT VIC AB 0 CT1 27XCR 8 STRN (SUTURE) ×6 IMPLANT
SUT VIC AB 0 CT1 36 (SUTURE) ×3 IMPLANT
SUT VIC AB 2-0 CT1 (SUTURE) ×6 IMPLANT
SUT VIC AB 4-0 FS2 27 (SUTURE) ×3 IMPLANT
SUT VIC AB 4-0 SH 27XANBCTRL (SUTURE) IMPLANT
SUT VICRYL 0 UR6 27IN ABS (SUTURE) ×6 IMPLANT
SYR 10ML LL (SYRINGE) ×6 IMPLANT
SYR CONTROL 10ML LL (SYRINGE) ×3 IMPLANT
SYR TOOMEY IRRIG 70ML (MISCELLANEOUS) ×3 IMPLANT
SYRINGE TOOMEY IRRIG 70ML (MISCELLANEOUS) ×3 IMPLANT
TOWEL OR 17X26 4PK STRL BLUE (TOWEL DISPOSABLE) ×3 IMPLANT
TRAP FLUID SMOKE EVACUATOR (MISCELLANEOUS) ×3 IMPLANT
TROCAR Z-THREAD FIOS 5X100MM (TROCAR) ×3 IMPLANT
TUBING EVAC SMOKE HEATED PNEUM (TUBING) ×3 IMPLANT

## 2023-04-26 NOTE — Anesthesia Preprocedure Evaluation (Addendum)
 Anesthesia Evaluation  Patient identified by MRN, date of birth, ID band Patient awake    Reviewed: Allergy & Precautions, NPO status , Patient's Chart, lab work & pertinent test results  History of Anesthesia Complications Negative for: history of anesthetic complications  Airway Mallampati: IV   Neck ROM: Full    Dental  (+) Missing   Pulmonary neg pulmonary ROS   Pulmonary exam normal breath sounds clear to auscultation       Cardiovascular hypertension, Normal cardiovascular exam Rhythm:Regular Rate:Normal  ECG 04/19/23:  Normal sinus rhythm Minimal voltage criteria for LVH, may be normal variant ( R in aVL ) Inferior infarct , age undetermined Cannot rule out Anterior infarct , age undetermined   Neuro/Psych HOH; mild cognitive impairment    GI/Hepatic ,GERD  ,,  Endo/Other  Hypothyroidism  Prediabetes   Renal/GU Renal disease (stage III CKD)Current UTI, on antibiotics     Musculoskeletal  (+) Arthritis ,    Abdominal   Peds  Hematology Breast CA   Anesthesia Other Findings   Reproductive/Obstetrics                             Anesthesia Physical Anesthesia Plan  ASA: 2  Anesthesia Plan: General   Post-op Pain Management:    Induction: Intravenous  PONV Risk Score and Plan: 3 and Ondansetron, Dexamethasone and Treatment may vary due to age or medical condition  Airway Management Planned: Oral ETT  Additional Equipment:   Intra-op Plan:   Post-operative Plan: Extubation in OR  Informed Consent: I have reviewed the patients History and Physical, chart, labs and discussed the procedure including the risks, benefits and alternatives for the proposed anesthesia with the patient or authorized representative who has indicated his/her understanding and acceptance.     Dental advisory given  Plan Discussed with: CRNA  Anesthesia Plan Comments: (Patient consented for risks  of anesthesia including but not limited to:  - adverse reactions to medications - damage to eyes, teeth, lips or other oral mucosa - nerve damage due to positioning  - sore throat or hoarseness - damage to heart, brain, nerves, lungs, other parts of body or loss of life  Informed patient about role of CRNA in peri- and intra-operative care.  Patient voiced understanding.)        Anesthesia Quick Evaluation

## 2023-04-26 NOTE — Progress Notes (Signed)
 Pt was seen in the ED yesterday for a UTI and given a prescription for antibiotics. Dr Luster Salters notified. Pt states that she feels better today.

## 2023-04-26 NOTE — Op Note (Signed)
 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]    * Pessary maintenance [Z46.89]    * Pre-op exam [Z01.818]  OPERATION: Procedure(s) (LRB): HYSTERECTOMY, VAGINAL, LAPAROSCOPY-ASSISTED, WITH SALPINGO-OOPHORECTOMY (Bilateral) COLPORRHAPHY, ANTERIOR, FOR CYSTOCELE REPAIR (N/A) CREATION, URETHRAL SLING, RETROPUBIC APPROACH, USING POLYPROPYLENE TAPE (N/A)    SURGEON(S): Surgeons and Role:    Linzie Collin, MD - Primary    * Hildred Laser, MD - Assisting   ANESTHESIA: General  ESTIMATED BLOOD LOSS: 80mL  SPECIMEN:  ID Type Source Tests Collected by Time Destination  1 : Uterus, Cervix, Bilateral Tubes and Ovaries GYN Bilateral Tubes, Ovaries, Cervix, and Uterus SURGICAL PATHOLOGY Linzie Collin, MD 04/26/2023 [phone]     COMPLICATIONS: None  DRAINS: Foley to gravity  DISPOSITION: Stable to recovery room  DESCRIPTION OF PROCEDURE:      The patient was prepped and draped in the dorsolithotomy position and placed under general anesthesia. The bladder was emptied. The cervix was grasped with a multi-toothed tenaculum and a uterine manipulator was placed within the cervical os respecting the position and curvature of the uterus. After changing gloves we proceeded abdominally. A small infraumbilical incision was made and a 5 mm trocar port was placed within the abdominopelvic cavity. The opening pressure was less than 7 mmHg.  Approximately 3 and 1/2 L of carbon dioxide gas was instilled within the abdominal pelvic cavity. The laparoscope was placed and the pelvis and abdomen were carefully inspected. In the usual manner, under direct visualization right and left lower quadrant ports of 5 mm size were placed. Both ureters were identified  in the pelvis prior to dissection or clamping and cutting of pedicles. The infundibulopelvic ligaments were carefully identified. The ureters were again identified out of the operative field. The ligaments were triply coagulated and divided. Hemostasis of the pedicles was noted. The fallopian tubes were elevated and the mesenteric side systematically coagulated and divided allowing the tube to be removed at the time of uterine removal. The round ligaments were coagulated and divided and a bladder flap was created. The upper aspect of the broad ligament was clamped coagulated and divided. The uterine arteries were skeletonized, triply coagulated and divided. Careful inspection of all pedicles and the remainder of the pelvis was performed. Hemostasis was noted. The lower quadrant ports were removed, hemostasis of the port sites was noted, and the incisions were closed in subcuticular manner. The laparoscope and trocar sleeve were removed from the infraumbilical incision, hemostasis was noted, and the incision was closed in a subcuticular manner. A long-acting anesthetic was employed in the skin incisions. We then proceeded vaginally. A weighted speculum was placed posteriorly. A multi-toothed tenaculum was used to grasp the cervix and the cervix was injected in a circumferential manner with a dilute Pitressin solution. An incision was made around the cervix and the vaginal mucosa was dissected off of the cervix. The posterior cul-de-sac was identified and entered and the weighted speculum was placed within this. The anterior cul-de-sac was identified and entered and a retractor was placed and used to retract the bladder anteriorly keeping it out of the operative field. The uterosacral ligaments were clamped divided and suture ligated. The cardinal ligaments were clamped divided and suture ligated. The small remaining pedicle was  clamped divided and suture ligated bilaterally allowing delivery of the  specimen. Angle sutures were placed in the usual manner. A culdoplasty was performed. The peritoneum was identified anteriorly and then incorporating the left upper pedicle left lower pedicle right lower pedicle right upper pedicle and anterior peritoneum a pursestring suture was placed exteriorizing all pedicles. Hemostasis of all pedicles was noted at this time.  The vaginal mucosa beginning at the vaginal cuff and overlying the bladder, was grasped with Allis clamps and injected with a dilute Pitressin solution in the midline. A midline incision was made to the level of the urethra. The vaginal mucosa was dissected laterally from the underlying attenuated fascia. A Foley catheter was placed within the bladder and the bladder was emptied. Clear urine was noted. The obturator foramina were identified in the usual manner bilaterally and marked with a marking pen the skin and subcutaneous tissues were injected with a dilute Pitressin solution. Stab incisions were made and the TOT trochars were placed through these incisions onto the operator's finger in the vagina which was retracted and bladder medially. The vaginal tape was then placed on the trochars and reversed through these incisions. A Kelly clamp was placed under the tape and the sleeves of the tape were removed. The tape was noted to be correctly positioned underneath the urethra without twists. The excess tape was removed at the level of the skin. Steri-Strips were applied over these small skin incisions. A typical Kelly plication was performed carefully covering the tension-free vaginal tape with thickened fascia. The bladder was plicated several sutures of 3-0 Vicryl.  A shelf of fascia was then approximated in the midline placing the bladder back in its more anatomic position.The excess vaginal mucosa was trimmed. Vaginal mucosa was then closed in the midline with interrupted sutures to the level of the vaginal cuff. The vaginal cuff was closed with  Vicryl suture. Hemostasis was noted. The posterior fourchette at approximately the hymenal ring was grasped using Allis clamps. The posterior vaginal mucosa was injected in the midline with a dilute Pitressin solution. A midline incision was made through the vaginal mucosa to the level of the vaginal cuff, and the vaginal mucosa was dissected laterally exposing the underlying attenuated fascia. Beginning at the vaginal cuff the attenuated fascia was grasped laterally and approximated in the midline thickening and tightening the fascia. These sutures were carried down to the level of the perineum. The excess vaginal mucosa was trimmed. The vagina was closed with interrupted sutures beginning at the vaginal cuff and carried down toward the perineal body. The perineal body was reinforced with multiple sutures of Vicryl. The mucosa was then closed over the perineal body in a subcuticular manner. Hemostasis was noted.  Clear urine was noted in the Foley.  The patient went to recovery room in stable condition. Clear urine was noted in the Foley at the conclusion of the procedure. Dr. Denman Fischer provided exposure, dissection, suctioning, retraction, and general support and assistance during the procedure.   Delice Felt, M.D. 04/26/2023 10:04 AM

## 2023-04-26 NOTE — Anesthesia Postprocedure Evaluation (Signed)
 Anesthesia Post Note  Patient: Christina Cuevas  Procedure(s) Performed: HYSTERECTOMY, VAGINAL, LAPAROSCOPY-ASSISTED, WITH SALPINGO-OOPHORECTOMY (Bilateral: Vagina ) COLPORRHAPHY, ANTERIOR, FOR CYSTOCELE REPAIR (Vagina ) CREATION, URETHRAL SLING, RETROPUBIC APPROACH, USING POLYPROPYLENE TAPE (Bladder)  Patient location during evaluation: PACU Anesthesia Type: General Level of consciousness: awake and alert, oriented and patient cooperative Pain management: pain level controlled Vital Signs Assessment: post-procedure vital signs reviewed and stable Respiratory status: spontaneous breathing, nonlabored ventilation and respiratory function stable Cardiovascular status: blood pressure returned to baseline and stable Postop Assessment: adequate PO intake Anesthetic complications: no   No notable events documented.   Last Vitals:  Vitals:   04/26/23 1015 04/26/23 1026  BP: 135/61   Pulse: 65 69  Resp: (!) 21 18  Temp:    SpO2: 100% 100%    Last Pain:  Vitals:   04/26/23 0637  TempSrc: Temporal  PainSc: 0-No pain                 Chaney Maclaren

## 2023-04-26 NOTE — Addendum Note (Signed)
 Addendum  created 04/26/23 1148 by Ellwood Haber, CRNA   Flowsheet accepted

## 2023-04-26 NOTE — H&P (Signed)
 PRE-OPERATIVE HISTORY AND PHYSICAL EXAM   PCP:  Monique Ano, MD Subjective:    HPI:  Christina Cuevas is a 83 y.o. [id number].  No LMP recorded. Patient is postmenopausal.  She presents today for a pre-op discussion and PE.   She has the following symptoms: Pelvic relaxation, stress urinary incontinence   She has been using a pessary and oxybutynin.  She does not like the pessary.  She has also continued to experience significant urine loss despite the pessary and despite use of oxybutynin.  She is requesting definitive surgery.  Seen in the ED yesterday for UTI symptoms - reports feels much better today after taking some Abx.   Review of Systems:    Constitutional: Denied constitutional symptoms, night sweats, recent illness, fatigue, fever, insomnia and weight loss.  Eyes: Denied eye symptoms, eye pain, photophobia, vision change and visual disturbance.  Ears/Nose/Throat/Neck: Denied ear, nose, throat or neck symptoms, hearing loss, nasal discharge, sinus congestion and sore throat.  Cardiovascular: Denied cardiovascular symptoms, arrhythmia, chest pain/pressure, edema, exercise intolerance, orthopnea and palpitations.  Respiratory: Denied pulmonary symptoms, asthma, pleuritic pain, productive sputum, cough, dyspnea and wheezing.  Gastrointestinal: Denied, gastro-esophageal reflux, melena, nausea and vomiting.  Genitourinary: See HPI for additional information.  Musculoskeletal: Denied musculoskeletal symptoms, stiffness, swelling, muscle weakness and myalgia.  Dermatologic: Denied dermatology symptoms, rash and scar.  Neurologic: Denied neurology symptoms, dizziness, headache, neck pain and syncope.  Psychiatric: Denied psychiatric symptoms, anxiety and depression.  Endocrine: Denied endocrine symptoms including hot flashes and night sweats.                     OB History  Gravida Para Term Preterm AB Living   3 3 3     3     SAB IAB Ectopic Multiple Live Births                        # Outcome Date GA Lbr Len/2nd Weight Sex Type Anes PTL Lv  3 Term                    2 Term                    1 Term                            Past Medical History:  Diagnosis Date   Actinic keratosis     Basal cell carcinoma 05/18/2006    Left pretibia   Breast cancer (HCC) 2011    left breast cancer treated with radiation   Chronic kidney disease      stage II d/t prediabetic   Colitis     Diverticulitis     Dysplastic nevus 01/18/2007    Left distal lat thigh sup. Slight to moderate atypia   Dysplastic nevus 01/18/2007    Right sup lat thigh. Moderate atypia, extends to one edge.   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 MMIS. Excised  01/10/2007, residual MIS, edges free   Pre-diabetes      diet controlled   Type 2 diabetes mellitus without complication, without long-term current use of insulin (HCC) 12/20/2017               Past Surgical History:  Procedure Laterality Date   APPENDECTOMY   1950   BREAST LUMPECTOMY Left 02/2009    lymph nodes removed   BREAST SURGERY Right 02/2009    sclerosing lesion removed at same time   BUNIONECTOMY Left 03/25/2018    Procedure: DOUBLE OSTEOTOMY;  Surgeon: Gwyneth Revels, DPM;  Location: [facility];  Service: Podiatry;  Laterality: Left;   COLONOSCOPY               SOCIAL HISTORY:   Tobacco Use History  Social History       Tobacco Use  Smoking Status Never  Smokeless Tobacco Never      Social History       Substance and Sexual Activity  Alcohol Use No     Social History       Substance and Sexual Activity  Drug Use No           Family History  Problem Relation Age of Onset   Parkinson's disease Mother     Pancreatic cancer Father     Cancer Paternal Uncle          2 uncles    Migraines Maternal Grandmother     Breast cancer Cousin             ALLERGIES:  Floxacillin (flucloxacillin), Cephalexin, Ciprofloxacin, and Metronidazole   MEDS:               Medications Ordered Prior to Encounter        Current Outpatient Medications on File Prior to Visit  Medication Sig Dispense Refill   atorvastatin (LIPITOR) 20 MG tablet Take 20 mg by mouth at bedtime.       cetirizine (ZYRTEC) 10 MG tablet Take 10 mg by mouth daily.        Cholecalciferol 25 MCG (1000 UT) tablet Take 4,000 Units by mouth 2 (two) times daily. 2000 mg in morning and again at night       DULoxetine (CYMBALTA) 30 MG capsule Take 30 mg by mouth daily.        estradiol (ESTRACE) 0.1 MG/GM vaginal cream Place 0.25 Applicatorfuls vaginally at bedtime. 90 g 3   levothyroxine (SYNTHROID, LEVOTHROID) 88 MCG tablet Take 88 mcg by mouth daily before breakfast.        lisinopril (PRINIVIL,ZESTRIL) 10 MG tablet Take 5 mg by mouth daily.       loperamide (IMODIUM) 2 MG capsule Take 4-6 mg by mouth as needed for diarrhea or loose stools.       ondansetron (ZOFRAN-ODT) 4 MG disintegrating tablet Take 1 tablet (4 mg total) by mouth every 6 (six) hours as needed for nausea or vomiting. 20 tablet 0   oxybutynin (DITROPAN XL) 10 MG 24 hr tablet Take 1 tablet (10 mg total) by mouth at bedtime. 90 tablet 0   OXYQUINOLONE SULFATE VAGINAL (TRIMO-SAN) 0.025 % GEL INSERT 1/3 APPLICATORFUL VAGINALLY EVERY OTHER DAY AS DIRECTED 113 g 2   pantoprazole (PROTONIX) 40 MG tablet Take 40 mg by mouth daily.       simethicone (MYLICON) 80 MG chewable tablet Chew 80 mg by mouth daily.        No current facility-administered medications on file prior to visit.  No orders of the defined types were placed in this encounter.   BP 99/62   Pulse 66   Ht 5\' 1"  (1.549 m)   Wt 146 lb 1.6 oz (66.3 kg)   BMI 27.61 kg/m    Physical examination General NAD, Conversant  HEENT Atraumatic; Op clear with mmm.  Normo-cephalic.  Anicteric sclerae  Thyroid/Neck Smooth without nodularity or enlargement.  Normal ROM.  Neck Supple.  Skin No rashes, lesions or ulceration. Normal palpated skin turgor. No nodularity.  Breasts: No masses or discharge.  Symmetric.  No axillary adenopathy.  Lungs: Clear to auscultation.No rales or wheezes. Normal Respiratory effort, no retractions.  Heart: NSR.  No murmurs or rubs appreciated. No peripheral edema  Abdomen: Soft.  Non-tender.  No masses.  No HSM. No hernia  Extremities: Moves all appropriately.  Normal ROM for age. No lymphadenopathy.  Neuro: Oriented to PPT.  Normal mood. Normal affect.              Pelvic:    Vulva: Normal appearance.  No lesions.   Vagina: No lesions or abnormalities noted.  Pessary in place   Support: Third-degree cystocele second-degree rectocele with uterine descensus.   Urethra No masses tenderness or scarring.   Meatus Normal size without lesions or prolapse.   Cervix: No lesions   Anus: Normal exam.  No lesions.   Perineum: Normal exam.  No lesions.         Bimanual    Uterus: Normal size    Adnexae: No masses.  Non-tender to palpation.        Assessment:    G3P3003     Patient Active Problem List    Diagnosis Date Noted   Primary malignant neoplasm of upper outer quadrant of female breast (HCC) 10/19/2022   Syncope and collapse 10/19/2022   Stage 3a chronic kidney disease (HCC) 05/18/2022   MS (multiple sclerosis) (HCC) 12/31/2020   Emphysema lung (HCC) 10/04/2020   DM type 2 with diabetic peripheral neuropathy (HCC) 12/20/2017   Type 2 diabetes mellitus without complication, without long-term current use of insulin (HCC) 12/20/2017   Incontinence of urine in female 07/28/2017   Cystocele with uterine descensus 04/20/2016   Acquired hypothyroidism 07/03/2013   GERD without esophagitis 07/03/2013   Pure hypercholesterolemia 07/03/2013      1. Pre-op exam   2. Cystocele with uterine descensus   3. Incontinence of urine in female   4. Pelvic relaxation disorder   5. Pessary maintenance     6. Recent  UTI   Plan:    Orders: No orders of the defined types were placed in this encounter.     1.  LAVH, BSO, anterior and posterior repair, TOT   Again discussed procedure with pt and husband.  Both agree to proceed with surgery.  Plan to attempt urination after case, but low threshold for home with catheter and abx as previously prescribed.

## 2023-04-26 NOTE — Anesthesia Procedure Notes (Signed)
 Procedure Name: Intubation Date/Time: 04/26/2023 7:40 AM  Performed by: Ellwood Haber, CRNAPre-anesthesia Checklist: Patient identified, Patient being monitored, Timeout performed, Emergency Drugs available and Suction available Patient Re-evaluated:Patient Re-evaluated prior to induction Oxygen Delivery Method: Circle system utilized Preoxygenation: Pre-oxygenation with 100% oxygen Induction Type: IV induction Ventilation: Mask ventilation without difficulty Laryngoscope Size: 3 and McGrath Grade View: Grade I Tube type: Oral Tube size: 7.0 mm Number of attempts: 1 Airway Equipment and Method: Stylet Placement Confirmation: ETT inserted through vocal cords under direct vision, positive ETCO2 and breath sounds checked- equal and bilateral Secured at: 21 cm Tube secured with: Tape Dental Injury: Teeth and Oropharynx as per pre-operative assessment  Comments: Smooth atraumatic intubation, no complications noted.

## 2023-04-26 NOTE — Transfer of Care (Signed)
 Immediate Anesthesia Transfer of Care Note  Patient: Christina Cuevas  Procedure(s) Performed: HYSTERECTOMY, VAGINAL, LAPAROSCOPY-ASSISTED, WITH SALPINGO-OOPHORECTOMY (Bilateral: Vagina ) COLPORRHAPHY, ANTERIOR, FOR CYSTOCELE REPAIR (Vagina ) CREATION, URETHRAL SLING, RETROPUBIC APPROACH, USING POLYPROPYLENE TAPE (Bladder)  Patient Location: PACU  Anesthesia Type:General  Level of Consciousness: drowsy  Airway & Oxygen Therapy: Patient Spontanous Breathing and Patient connected to face mask oxygen  Post-op Assessment: Report given to RN and Post -op Vital signs reviewed and stable  Post vital signs: Reviewed and stable  Last Vitals:  Vitals Value Taken Time  BP 135/61 04/26/23 1015  Temp 35.9 1015  Pulse 63 04/26/23 1020  Resp 15 04/26/23 1020  SpO2 100 % 04/26/23 1020  Vitals shown include unfiled device data.  Last Pain:  Vitals:   04/26/23 0637  TempSrc: Temporal  PainSc: 0-No pain         Complications: No notable events documented.

## 2023-04-27 ENCOUNTER — Encounter: Payer: Self-pay | Admitting: Obstetrics and Gynecology

## 2023-04-28 LAB — SURGICAL PATHOLOGY

## 2023-05-04 ENCOUNTER — Ambulatory Visit: Admitting: Obstetrics and Gynecology

## 2023-05-04 ENCOUNTER — Encounter: Payer: Self-pay | Admitting: Obstetrics and Gynecology

## 2023-05-04 VITALS — BP 131/78 | HR 64 | Ht 61.0 in | Wt 143.1 lb

## 2023-05-04 DIAGNOSIS — Z9889 Other specified postprocedural states: Secondary | ICD-10-CM

## 2023-05-04 NOTE — Progress Notes (Signed)
 HPI:      Christina Cuevas is a 83 y.o. G3P3003 who LMP was No LMP recorded. Patient is postmenopausal.  Subjective:   She presents today 6 weeks from LAVH BSO AMP repair TOT.  She reports that she is very happy with her surgery.  She has had minimal pain.  She has no more issues with prolapse.  She rarely leaks a small amount of urine which is a dramatic change for her.  She is having bowel movements and voiding without issue.    Hx: The following portions of the patient's history were reviewed and updated as appropriate:             She  has a past medical history of Actinic keratosis, Basal cell carcinoma (05/18/2006), Breast cancer (HCC) (2011), Chronic kidney disease, Colitis, Cystocele with small rectocele and uterine descent, Cystocele with third degree uterine prolapse, Diverticulitis, Dysplastic nevus (01/18/2007), Dysplastic nevus (01/18/2007), Environmental allergies, Hyperchloremia, Hypertension, Hypothyroidism, Melanoma in situ (11/23/2006), Mild cognitive impairment, Other emphysema (HCC), Pre-diabetes, Primary stress urinary incontinence, Stage 3a chronic kidney disease (CKD) (HCC), Type 2 diabetes mellitus without complication, without long-term current use of insulin  (HCC) (12/20/2017), and UTI (urinary tract infection) (04/25/2023). She does not have any pertinent problems on file. She  has a past surgical history that includes Colonoscopy; Breast lumpectomy (Left, 02/2009); Breast surgery (Right, 02/2009); Appendectomy (1950); Bunionectomy (Left, 03/25/2018); Laparoscopic vaginal hysterectomy with salpingo oophorectomy (Bilateral, 04/26/2023); Cystocele repair (N/A, 04/26/2023); and Bladder suspension (N/A, 04/26/2023). Her family history includes Breast cancer in her cousin; Cancer in her paternal uncle; Migraines in her maternal grandmother; Pancreatic cancer in her father; Parkinson's disease in her mother. She  reports that she has never smoked. She has never used smokeless tobacco.  She reports that she does not drink alcohol and does not use drugs. She has a current medication list which includes the following prescription(s): atorvastatin, cetirizine, cholecalciferol, duloxetine , levothyroxine , lisinopril, loperamide , ondansetron , ondansetron , trimo-san, pantoprazole , and simethicone . She is allergic to floxacillin (flucloxacillin), cephalexin , ciprofloxacin, and metronidazole .       Review of Systems:  Review of Systems  Constitutional: Denied constitutional symptoms, night sweats, recent illness, fatigue, fever, insomnia and weight loss.  Eyes: Denied eye symptoms, eye pain, photophobia, vision change and visual disturbance.  Ears/Nose/Throat/Neck: Denied ear, nose, throat or neck symptoms, hearing loss, nasal discharge, sinus congestion and sore throat.  Cardiovascular: Denied cardiovascular symptoms, arrhythmia, chest pain/pressure, edema, exercise intolerance, orthopnea and palpitations.  Respiratory: Denied pulmonary symptoms, asthma, pleuritic pain, productive sputum, cough, dyspnea and wheezing.  Gastrointestinal: Denied, gastro-esophageal reflux, melena, nausea and vomiting.  Genitourinary: Denied genitourinary symptoms including symptomatic vaginal discharge, pelvic relaxation issues, and urinary complaints.  Musculoskeletal: Denied musculoskeletal symptoms, stiffness, swelling, muscle weakness and myalgia.  Dermatologic: Denied dermatology symptoms, rash and scar.  Neurologic: Denied neurology symptoms, dizziness, headache, neck pain and syncope.  Psychiatric: Denied psychiatric symptoms, anxiety and depression.  Endocrine: Denied endocrine symptoms including hot flashes and night sweats.   Meds:   Current Outpatient Medications on File Prior to Visit  Medication Sig Dispense Refill   atorvastatin (LIPITOR) 20 MG tablet Take 20 mg by mouth at bedtime.     cetirizine (ZYRTEC) 10 MG tablet Take 10 mg by mouth daily.      Cholecalciferol 25 MCG (1000 UT)  tablet Take 4,000 Units by mouth 2 (two) times daily. 2000 mg in morning and again at night     DULoxetine  (CYMBALTA ) 30 MG capsule Take 30 mg by mouth daily.  levothyroxine  (SYNTHROID , LEVOTHROID) 88 MCG tablet Take 88 mcg by mouth daily before breakfast.      lisinopril (PRINIVIL,ZESTRIL) 10 MG tablet Take 5 mg by mouth daily.     loperamide  (IMODIUM ) 2 MG capsule Take 4-6 mg by mouth as needed for diarrhea or loose stools.     ondansetron  (ZOFRAN ) 4 MG tablet Take 1 tablet (4 mg total) by mouth every 8 (eight) hours as needed for nausea or vomiting. 20 tablet 0   ondansetron  (ZOFRAN -ODT) 4 MG disintegrating tablet Take 1 tablet (4 mg total) by mouth every 6 (six) hours as needed for nausea or vomiting. 20 tablet 0   OXYQUINOLONE SULFATE VAGINAL (TRIMO-SAN) 0.025 % GEL INSERT 1/3 APPLICATORFUL VAGINALLY EVERY OTHER DAY AS DIRECTED 113 g 2   pantoprazole  (PROTONIX ) 40 MG tablet Take 40 mg by mouth daily.     simethicone  (MYLICON) 80 MG chewable tablet Chew 80 mg by mouth daily.     No current facility-administered medications on file prior to visit.      Objective:     Vitals:   05/04/23 1056  BP: 131/78  Pulse: 64   Filed Weights   05/04/23 1056  Weight: 143 lb 1.6 oz (64.9 kg)                        Assessment:    G3P3003 Patient Active Problem List   Diagnosis Date Noted   SUI (stress urinary incontinence, female) 04/26/2023   Primary malignant neoplasm of upper outer quadrant of female breast (HCC) 10/19/2022   Syncope and collapse 10/19/2022   Stage 3a chronic kidney disease (HCC) 05/18/2022   MS (multiple sclerosis) (HCC) 12/31/2020   Emphysema lung (HCC) 10/04/2020   DM type 2 with diabetic peripheral neuropathy (HCC) 12/20/2017   Type 2 diabetes mellitus without complication, without long-term current use of insulin  (HCC) 12/20/2017   Incontinence of urine in female 07/28/2017   Cystocele with uterine prolapse 04/20/2016   Acquired hypothyroidism 07/03/2013    GERD without esophagitis 07/03/2013   Pure hypercholesterolemia 07/03/2013     1. Postoperative state     Excellent recovery.   Plan:            1.  May resume normal activities with exception of heavy lifting.  2.  Resume vaginal estrogen as directed 2 times per week. Orders No orders of the defined types were placed in this encounter.   No orders of the defined types were placed in this encounter.     F/U  Return in about 3 months (around 08/03/2023).  Delice Felt, M.D. 05/04/2023 11:33 AM

## 2023-05-04 NOTE — Progress Notes (Signed)
 Patient presents for 1 week postop follow-up following LAVH with BSO, anterior repair with sling. She states doing well, no complaints of pain and minimal spotting at this time.

## 2023-05-20 ENCOUNTER — Encounter: Payer: Self-pay | Admitting: Dermatology

## 2023-05-20 ENCOUNTER — Ambulatory Visit: Admitting: Dermatology

## 2023-05-20 DIAGNOSIS — L820 Inflamed seborrheic keratosis: Secondary | ICD-10-CM

## 2023-05-20 DIAGNOSIS — L821 Other seborrheic keratosis: Secondary | ICD-10-CM | POA: Diagnosis not present

## 2023-05-20 NOTE — Progress Notes (Signed)
   New Patient Visit   Subjective  Christina Cuevas is a 83 y.o. female who presents for the following: patient reports spots at arms that she would like checked.  The patient has spots, moles and lesions to be evaluated, some may be new or changing and the patient may have concern these could be cancer.   The following portions of the chart were reviewed this encounter and updated as appropriate: medications, allergies, medical history  Review of Systems:  No other skin or systemic complaints except as noted in HPI or Assessment and Plan.  Objective  Well appearing patient in no apparent distress; mood and affect are within normal limits.   A focused examination was performed of the following areas: B/l arms   Relevant exam findings are noted in the Assessment and Plan.  left arm x 7 and right arm x 6 (13) Erythematous stuck-on, waxy papule or plaque  Assessment & Plan   SEBORRHEIC KERATOSIS - Stuck-on, waxy, tan-brown papules and/or plaques  - Benign-appearing - Discussed benign etiology and prognosis. - Observe - Call for any changes   INFLAMED SEBORRHEIC KERATOSIS (13) left arm x 7 and right arm x 6 (13) Symptomatic, irritating, patient would like treated. Destruction of lesion - left arm x 7 and right arm x 6 (13) Complexity: simple   Destruction method: cryotherapy   Informed consent: discussed and consent obtained   Timeout:  patient name, date of birth, surgical site, and procedure verified Lesion destroyed using liquid nitrogen: Yes   Region frozen until ice ball extended beyond lesion: Yes   Cryo cycles: 1 or 2. Outcome: patient tolerated procedure well with no complications   Post-procedure details: wound care instructions given    Return if symptoms worsen or fail to improve.  I, Randee Busing, CMA, am acting as scribe for Harris Liming, MD.   Documentation: I have reviewed the above documentation for accuracy and completeness, and I agree with the  above.  Harris Liming, MD

## 2023-05-20 NOTE — Patient Instructions (Addendum)

## 2023-08-25 ENCOUNTER — Other Ambulatory Visit: Payer: Self-pay | Admitting: Obstetrics and Gynecology

## 2023-11-25 ENCOUNTER — Other Ambulatory Visit: Payer: Self-pay | Admitting: Obstetrics and Gynecology

## 2023-11-29 ENCOUNTER — Telehealth: Payer: Self-pay

## 2023-11-29 NOTE — Telephone Encounter (Signed)
 Called left voicemail fro Christina Cuevas to return my call about her Oxybutynin  (Ditropan -XL) 10 MG 24 hr tablet. She was seeing Dr Janit who is no longer here. Dr. Leigh agreed to send in a 30 day supply and then she needs to see someone.

## 2023-12-29 ENCOUNTER — Other Ambulatory Visit: Payer: Self-pay | Admitting: Obstetrics
